# Patient Record
Sex: Female | Born: 1940 | Race: White | Hispanic: No | Smoking: Never smoker
Health system: Southern US, Community
[De-identification: ages and names within clinical notes are randomized; demographics above are authoritative.]

## PROBLEM LIST (undated history)

## (undated) DIAGNOSIS — C4490 Unspecified malignant neoplasm of skin, unspecified: Secondary | ICD-10-CM

## (undated) DIAGNOSIS — T7840XA Allergy, unspecified, initial encounter: Secondary | ICD-10-CM

## (undated) DIAGNOSIS — B029 Zoster without complications: Secondary | ICD-10-CM

## (undated) DIAGNOSIS — M810 Age-related osteoporosis without current pathological fracture: Secondary | ICD-10-CM

## (undated) DIAGNOSIS — E785 Hyperlipidemia, unspecified: Secondary | ICD-10-CM

## (undated) HISTORY — DX: Allergy, unspecified, initial encounter: T78.40XA

## (undated) HISTORY — DX: Unspecified malignant neoplasm of skin, unspecified: C44.90

## (undated) HISTORY — DX: Zoster without complications: B02.9

## (undated) HISTORY — DX: Hyperlipidemia, unspecified: E78.5

## (undated) HISTORY — DX: Age-related osteoporosis without current pathological fracture: M81.0

## (undated) HISTORY — PX: SEPTOPLASTY: SUR1290

---

## 2001-09-29 ENCOUNTER — Encounter: Payer: Self-pay | Admitting: Obstetrics and Gynecology

## 2001-09-29 ENCOUNTER — Encounter: Admission: RE | Admit: 2001-09-29 | Discharge: 2001-09-29 | Payer: Self-pay | Admitting: Obstetrics and Gynecology

## 2009-03-01 ENCOUNTER — Encounter: Admission: RE | Admit: 2009-03-01 | Discharge: 2009-03-01 | Payer: Self-pay | Admitting: Family Medicine

## 2010-10-16 IMAGING — CR DG CHEST 2V
2 series · 2 of 2 positions shown · non-contrast
Comparison: None

CLINICAL DATA: Left chest and left axillary pain

CHEST - 2 VIEW

[w chest pa]
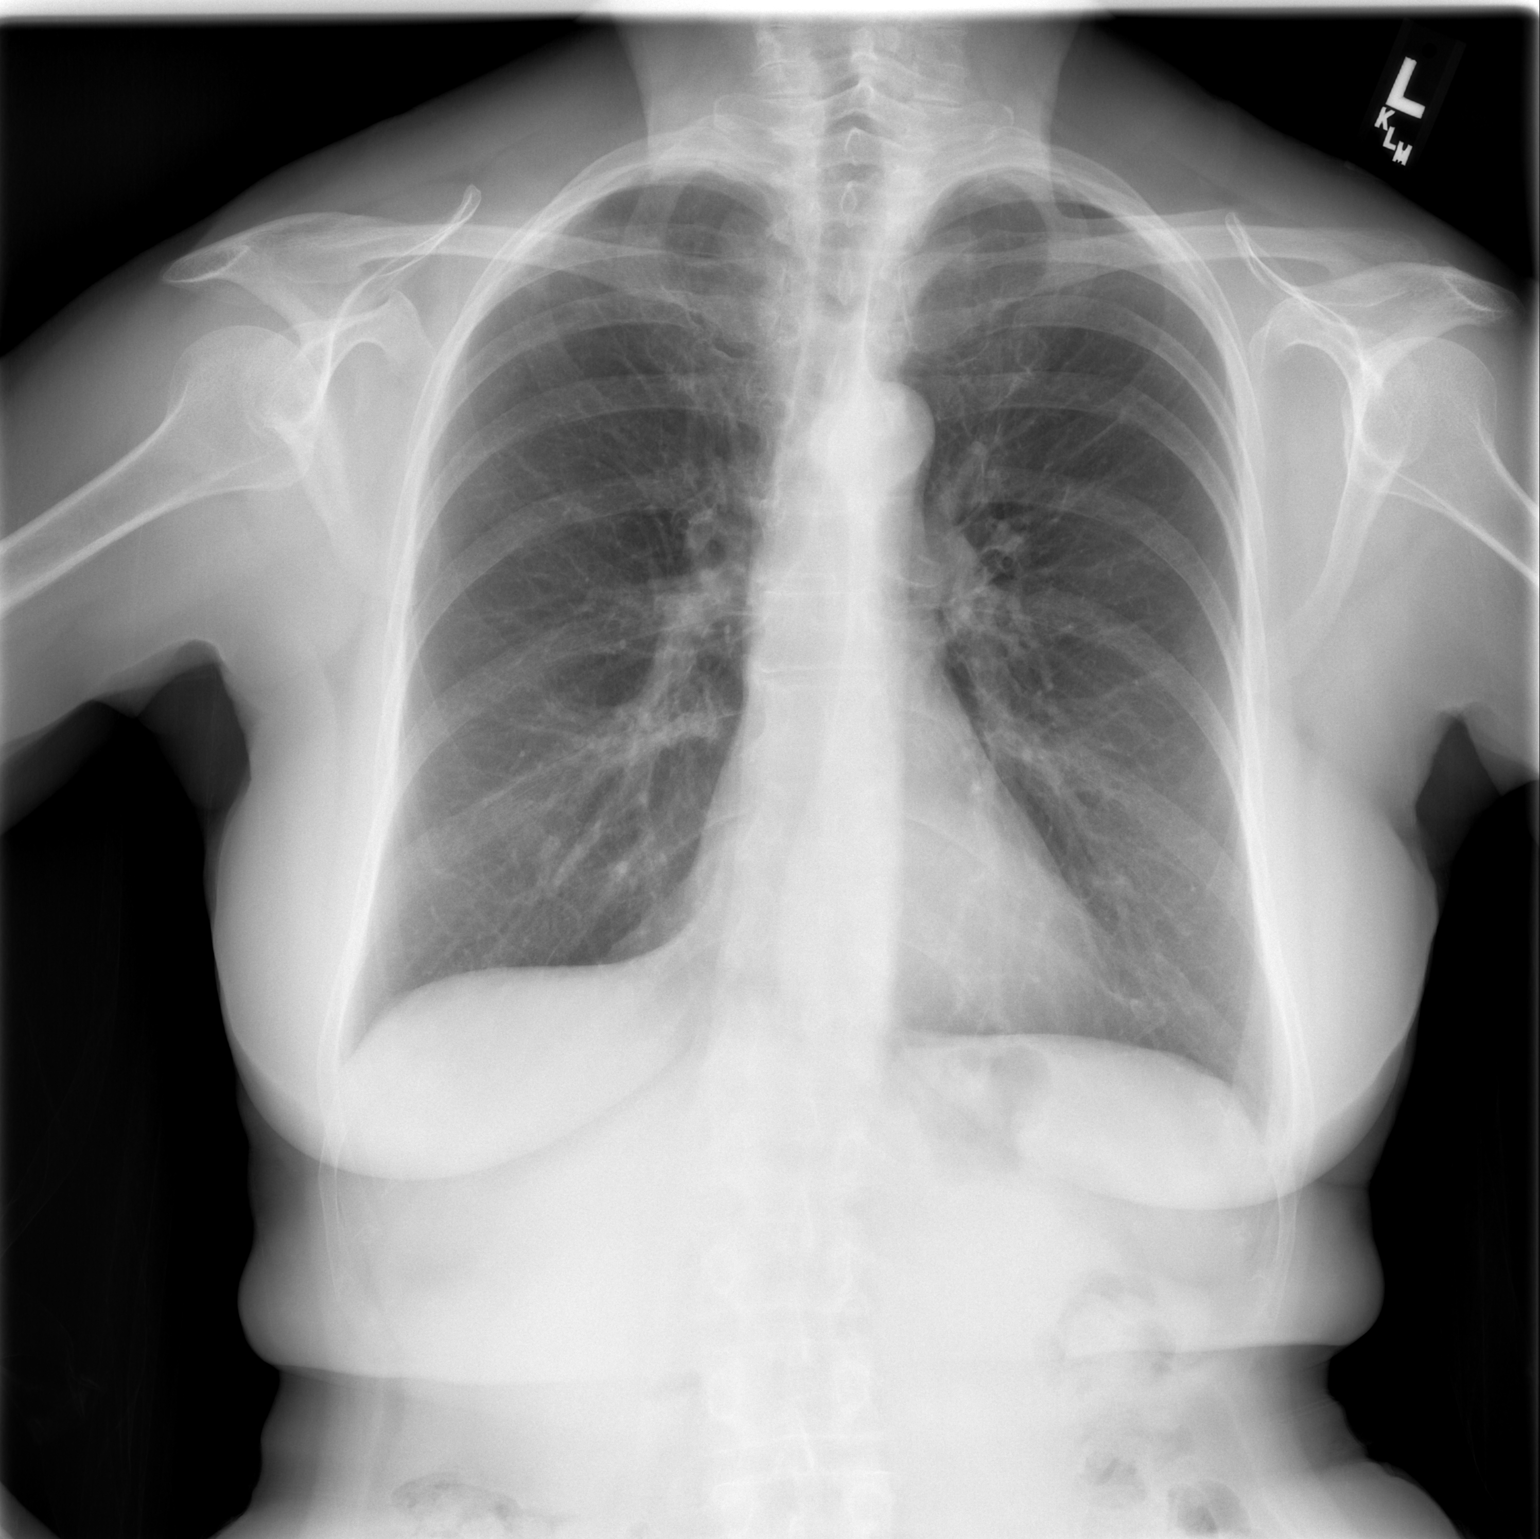

[w chest lat]
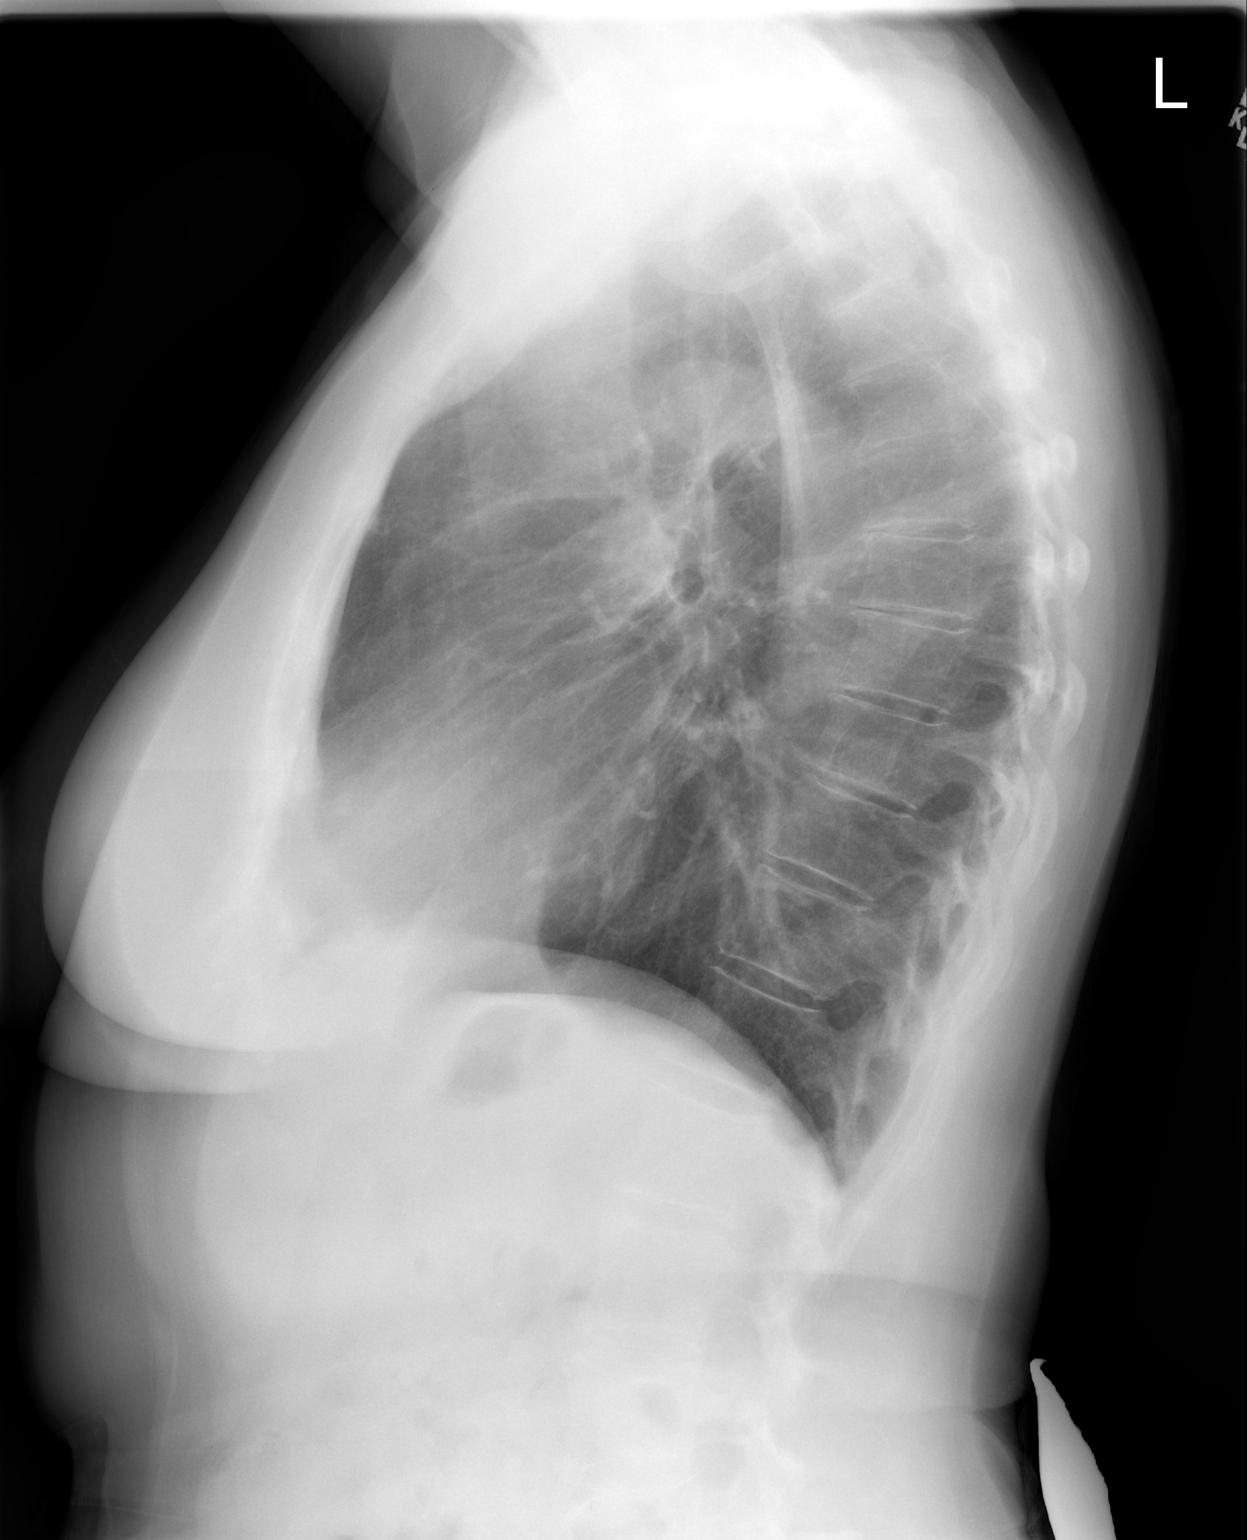

[2 of 2 positions shown; findings below may reference images not displayed]

FINDINGS: The lungs are clear. No apical lung lesion is seen.  No
hilar adenopathy is seen. The heart is within normal limits in
size. No bony abnormality is seen.
IMPRESSION: No active lung disease.

## 2011-03-01 LAB — HM PAP SMEAR: HM Pap smear: NORMAL

## 2011-05-16 ENCOUNTER — Ambulatory Visit (INDEPENDENT_AMBULATORY_CARE_PROVIDER_SITE_OTHER): Payer: Medicare Other | Admitting: Family Medicine

## 2011-05-16 ENCOUNTER — Encounter: Payer: Self-pay | Admitting: Family Medicine

## 2011-05-16 DIAGNOSIS — E7800 Pure hypercholesterolemia, unspecified: Secondary | ICD-10-CM

## 2011-05-16 DIAGNOSIS — Z79899 Other long term (current) drug therapy: Secondary | ICD-10-CM

## 2011-05-16 DIAGNOSIS — E559 Vitamin D deficiency, unspecified: Secondary | ICD-10-CM

## 2011-05-16 LAB — COMPREHENSIVE METABOLIC PANEL
ALT: 16 U/L (ref 0–35)
AST: 23 U/L (ref 0–37)
Albumin: 4.1 g/dL (ref 3.5–5.2)
Alkaline Phosphatase: 93 U/L (ref 39–117)
BUN: 17 mg/dL (ref 6–23)
CO2: 19 mEq/L (ref 19–32)
Calcium: 9.6 mg/dL (ref 8.4–10.5)
Chloride: 109 mEq/L (ref 96–112)
Creat: 0.86 mg/dL (ref 0.40–1.20)
Glucose, Bld: 87 mg/dL (ref 70–99)
Potassium: 4.4 mEq/L (ref 3.5–5.3)
Sodium: 143 mEq/L (ref 135–145)
Total Bilirubin: 0.5 mg/dL (ref 0.3–1.2)
Total Protein: 7.1 g/dL (ref 6.0–8.3)

## 2011-05-16 LAB — LIPID PANEL
HDL: 50 mg/dL (ref >39)
Triglycerides: 196 mg/dL — ABNORMAL HIGH (ref <150)

## 2011-05-16 NOTE — Progress Notes (Signed)
Subjective:    Patient ID: Holly Russo, female    DOB: 08-29-1940, 71 y.o.   MRN: [id number]  HPI Patient presents to re-establish care, and follow up on high cholesterol.  Previously took Zocor 20mg , had some achiness, but not sure it was really related to the statin medication. Has had some ongoing foot pain, even off the medication.  Per old records, last lipid check was in 2010, and was well controlled with the 20mg  of zocor.  Has been off the medication for about a year.  Resumed taking red yeast rice once daily since off the Zocor.  Following a low cholesterol diet.  She is fasting for labs today  She has a h/o low Vitamin D, and would like that checked today.  She has ongoing problems with R foot pain.  Has seen Dr. Charlett Blake, and was told it was due to arthritis.  She would like another opinion and wondering who she should see.  Past Medical History  Diagnosis Date  . Hyperlipidemia elevated chol  . Vitamin D deficiency   . Osteopenia borderline osteoporosis 10/09    -2.4 l hip 2009, last DEXA 2011 thru GYN  . Allergy   . Skin cancer     Dr. Margo Aye  . Shingles 2010    Past Surgical History  Procedure Date  . Septoplasty     History   Social History  . Marital Status: Widowed    Spouse Name: N/A    Number of Children: 3  . Years of Education: N/A   Occupational History  . Retired Public librarian    Social History Main Topics  . Smoking status: Never Smoker   . Smokeless tobacco: Not on file  . Alcohol Use: Yes     socially, glass of wine 5 times a week  . Drug Use: No  . Sexually Active: Not on file   Other Topics Concern  . Not on file   Social History Narrative  . No narrative on file    Family History  Problem Relation Age of Onset  . Coronary artery disease Mother   . Hypertension Mother   . Hyperlipidemia Mother   . Heart disease Mother   . Alzheimer's disease Father   . Thyroid disease Father   . Hyperlipidemia Sister   . Cancer  Paternal Aunt     breast  . Cancer Maternal Grandmother     pancreatic  . Diabetes Paternal Grandmother     Current outpatient prescriptions:aspirin 81 MG tablet, Take 81 mg by mouth daily.  , Disp: , Rfl: ;  Calcium Carbonate-Vitamin D (CALCIUM-VITAMIN D) 500-200 MG-UNIT per tablet, Take 1 tablet by mouth daily.  , Disp: , Rfl: ;  fish oil-omega-3 fatty acids 1000 MG capsule, Take 1 g by mouth daily.  , Disp: , Rfl: ;  Multiple Vitamins-Minerals (MULTIVITAMIN WITH MINERALS) tablet, Take 1 tablet by mouth daily.  , Disp: , Rfl:  Red Yeast Rice 600 MG TABS, Take 1 tablet by mouth daily.  , Disp: , Rfl: ;  DISCONTD: Calcium-Vitamin D-Vitamin K (CALCIUM + D + K PO), Take by mouth.  , Disp: , Rfl:   No Known Allergies  Review of Systems R foot pain.  No myalgias; Some intermittent L chest wall (lateral) pain, evaluated in the past, unchanged, intermittent. Occ heartburn (when taking NSAIDs).  Denies exertional chest pain, SOB, palpitations.  Denies GI complaints, urinary complaints, skin concerns, other joint pains.  Denies fevers, URI symptoms, or other concerns  Objective:   Physical Exam  Well developed, well nourished patient, in no distress BP 128/70  Pulse 64  Ht 5' 3.5" (1.613 m)  Wt 129 lb (58.514 kg)  BMI 22.49 kg/m2 Neck: No lymphadenopathy or thyromegaly, no carotid bruit Heart:  Regular rate and rhythm, no murmurs, rubs, gallops or ectopy Chest:  Tender L rib, focal area, mild Lungs:  Clear bilaterally, without wheezes, rales or ronchi Abdomen:  Soft, nontender, nondistended, no hepatosplenomegaly or masses, normal bowel sounds Extremities:  No clubbing, cyanosis or edema, 2+ pulses.  Neuro:  Alert and oriented x 3, cranial nerves grossly intact.  DTR's 2+ and symmetric.  Normal strength and sensation Back:  No spine or CVA tenderness Skin: no rashes or suspicious lesions Psych:  Normal mood, affect, hygiene and grooming, normal speech, eye contact      Assessment &  Plan:   1. Pure hypercholesterolemia  Comprehensive metabolic panel, Lipid panel  2. Unspecified vitamin D deficiency  Vitamin D 25 hydroxy  3. Encounter for long-term (current) use of other medications  Comprehensive metabolic panel   Pharmacy: [facility], Zocor 20mg  if needed per lab results Continue low cholesterol diet and regular exercise.  Foot pain--recommended that she see either Dr. Lestine Box or a podiatrist (rec [facility]) for second opinion regarding her ongoing foot pain

## 2011-05-17 ENCOUNTER — Telehealth: Payer: Self-pay | Admitting: *Deleted

## 2011-05-17 NOTE — Telephone Encounter (Signed)
Left message for pt to return my call.vs

## 2011-05-18 ENCOUNTER — Telehealth: Payer: Self-pay | Admitting: Family Medicine

## 2011-05-18 NOTE — Telephone Encounter (Signed)
Pt came by for lab results, gave pt copy & advised above notes from Dr. Lynelle Doctor and called in Simvastatin to [facility] [phone] as above-LM

## 2011-05-21 ENCOUNTER — Telehealth: Payer: Self-pay | Admitting: Family Medicine

## 2011-05-21 NOTE — Telephone Encounter (Signed)
Message copied by Laureen Ochs on Mon May 21, 2011 12:11 PM ------      Message from: KNAPP, EVE      Created: Thu May 17, 2011  1:29 PM       Advise pt of results.  Recommend re-starting simvastatin 20mg  daily, and stopping the red yeast rice.  Please send Rx to [facility] for #30 with 3 refills, and schedule LFT/FLP labs for 3 months.  Chem panel and Vitamin D are normal

## 2011-05-22 ENCOUNTER — Encounter: Payer: Self-pay | Admitting: Family Medicine

## 2011-05-22 NOTE — Progress Notes (Signed)
Phoned pt. lmtrc re labs.

## 2011-05-23 ENCOUNTER — Telehealth: Payer: Self-pay | Admitting: *Deleted

## 2011-05-23 NOTE — Telephone Encounter (Signed)
Left message again for pt to call me [id number].

## 2011-05-25 NOTE — Progress Notes (Signed)
Left message for pt to return my call to go over lab results.

## 2011-05-31 ENCOUNTER — Telehealth: Payer: Self-pay | Admitting: *Deleted

## 2011-05-31 NOTE — Telephone Encounter (Signed)
Left message for patient to return my call for lab results. vs

## 2011-08-27 ENCOUNTER — Encounter: Payer: Self-pay | Admitting: Family Medicine

## 2011-08-27 ENCOUNTER — Ambulatory Visit (INDEPENDENT_AMBULATORY_CARE_PROVIDER_SITE_OTHER): Payer: Medicare Other | Admitting: Family Medicine

## 2011-08-27 VITALS — BP 140/88 | HR 76 | Ht 64.0 in | Wt 126.0 lb

## 2011-08-27 DIAGNOSIS — B029 Zoster without complications: Secondary | ICD-10-CM

## 2011-08-27 MED ORDER — ACYCLOVIR 800 MG PO TABS: ORAL_TABLET | ORAL | Status: DC

## 2011-08-27 NOTE — Patient Instructions (Signed)
Please take the medication 5 times/day for a week.  If you start having any eye symptoms at all in the right eye, please follow up with your eye doctor

## 2011-08-27 NOTE — Progress Notes (Signed)
Last night, before going to bed, she noticed that her forehead was itchy, then noticed some little red bumps in same area that she had shingles in the past.  Last episode was 2 years ago.  Has had constant itchy feeling in that area of her forehead ever since her shingles.  Last episode of shingles involved her forehead, eye, nose and scalp (occurred while she was in [place]).  Itching has intensified, now slightly tingly also.  She took Acyclovir this morning--it was something her daughter had given her (she takes for herpes, ?might be 400mg ).  Denies any eye symptoms currently.  Past Medical History  Diagnosis Date  . Hyperlipidemia elevated chol  . Vitamin D deficiency   . Osteopenia borderline osteoporosis 10/09    -2.4 l hip 2009, last DEXA 2011 thru GYN  . Allergy   . Skin cancer     Dr. Margo Aye  . Shingles 2010    Past Surgical History  Procedure Date  . Septoplasty     History   Social History  . Marital Status: Widowed    Spouse Name: N/A    Number of Children: 3  . Years of Education: N/A   Occupational History  . Retired Public librarian    Social History Main Topics  . Smoking status: Never Smoker   . Smokeless tobacco: Not on file  . Alcohol Use: Yes     socially, glass of wine 5 times a week  . Drug Use: No  . Sexually Active: Not on file   Other Topics Concern  . Not on file   Social History Narrative  . No narrative on file    Family History  Problem Relation Age of Onset  . Coronary artery disease Mother   . Hypertension Mother   . Hyperlipidemia Mother   . Heart disease Mother   . Alzheimer's disease Father   . Thyroid disease Father   . Hyperlipidemia Sister   . Cancer Paternal Aunt     breast  . Cancer Maternal Grandmother     pancreatic  . Diabetes Paternal Grandmother     Current outpatient prescriptions:aspirin 81 MG tablet, Take 81 mg by mouth daily.  , Disp: , Rfl: ;  Calcium Carbonate-Vitamin D (CALCIUM-VITAMIN D) 500-200 MG-UNIT  per tablet, Take 1 tablet by mouth daily.  , Disp: , Rfl: ;  fish oil-omega-3 fatty acids 1000 MG capsule, Take 1 g by mouth daily.  , Disp: , Rfl: ;  Multiple Vitamins-Minerals (MULTIVITAMIN WITH MINERALS) tablet, Take 1 tablet by mouth daily.  , Disp: , Rfl:  simvastatin (ZOCOR) 20 MG tablet, Take 20 mg by mouth at bedtime.  , Disp: , Rfl: ;  acyclovir (ZOVIRAX) 800 MG tablet, Take 1 tablet by mouth 5 times a day for 7 days, Disp: 35 tablet, Rfl: 0  Allergies  Allergen Reactions  . Sulfa Antibiotics Rash   ROS: Denies fevers, eye symptoms, nose symptoms, URI symptoms, or other concerns  BP 140/88  Pulse 76  Ht 5\' 4"  (1.626 m)  Wt 126 lb (57.153 kg)  BMI 21.63 kg/m2 Pleasant, somewhat anxious female, in no distress Face: three horizontal excoriations on forehead. No vesicles or other rash noted, somewhat rough to the touch R frontal area.  Nose without lesions. Conjunctiva and sclera clear. Neck: no lymphadenopathy  ASSESSMENT/PLAN: 1. Shingles  acyclovir (ZOVIRAX) 800 MG tablet   F/u with optho if eye symptoms develop

## 2012-02-18 ENCOUNTER — Encounter: Payer: Self-pay | Admitting: Internal Medicine

## 2012-02-21 ENCOUNTER — Encounter: Payer: Self-pay | Admitting: Family Medicine

## 2012-02-21 ENCOUNTER — Ambulatory Visit (INDEPENDENT_AMBULATORY_CARE_PROVIDER_SITE_OTHER): Payer: Medicare Other | Admitting: Family Medicine

## 2012-02-21 DIAGNOSIS — E7800 Pure hypercholesterolemia, unspecified: Secondary | ICD-10-CM

## 2012-02-21 DIAGNOSIS — IMO0001 Reserved for inherently not codable concepts without codable children: Secondary | ICD-10-CM

## 2012-02-21 DIAGNOSIS — R030 Elevated blood-pressure reading, without diagnosis of hypertension: Secondary | ICD-10-CM

## 2012-02-21 NOTE — Patient Instructions (Signed)
Try and exercise at least 30 minutes daily.  Follow low sodium (see below). Try and check your blood pressure elsewhere, and write down.  Bring your list of BP's and your monitor to your next visit  Return for fasting bloodwork.  I suspect that we will need to start a lipid-lowering medication.  We will try atorvastatin (generic Lipitor), but I need to see labs before deciding dose.  Start taking Coenzyme Q10 along with the statin medication as this can help prevent side effects.  Return in 2-3 months for repeat labs, to make sure that the dose is sufficient and that liver is handling the medication  I recommend [facility] for evaluation of ongoing right foot pain

## 2012-02-21 NOTE — Progress Notes (Signed)
Chief complaint:  med check, having muscle pain from simvastatin. Hasn't been taking for several months and feels better. She isn't fasting today  HPI: Hyperlipidemia follow-up: She restarted simvastatin after labs last May.  Took it until December, realized that she was achey, sore, hurt to get out of the car.  Stopped taking it and achiness resolved. Hasn't been taking red yeast rice.  Elevated BP today.  Doesn't check BP elsewhere.  Denies increased sodium intake.  Walks some, but not as much as in warmer weather.   Shingles to R forehead in August--occasionally has some tingling, feels the need to scratch, but no discomfort or burning pain.  Past Medical History  Diagnosis Date  . Hyperlipidemia elevated chol  . Vitamin d deficiency   . Osteopenia borderline osteoporosis 10/09    -2.4 l hip 2009, last DEXA 2011 thru GYN  . Allergy   . Skin cancer     Dr. Margo Aye  . Shingles 2010, 08/2011    Past Surgical History  Procedure Date  . Septoplasty     History   Social History  . Marital Status: Widowed    Spouse Name: N/A    Number of Children: 3  . Years of Education: N/A   Occupational History  . Retired Public librarian    Social History Main Topics  . Smoking status: Never Smoker   . Smokeless tobacco: Never Used  . Alcohol Use: Yes     socially, glass of wine 5 times a week  . Drug Use: No  . Sexually Active: Not on file   Other Topics Concern  . Not on file   Social History Narrative  . No narrative on file    Family History  Problem Relation Age of Onset  . Coronary artery disease Mother   . Hypertension Mother   . Hyperlipidemia Mother   . Heart disease Mother   . Alzheimer's disease Father   . Thyroid disease Father   . Hyperlipidemia Sister   . Cancer Paternal Aunt     breast  . Cancer Maternal Grandmother     pancreatic  . Diabetes Paternal Grandmother     Current outpatient prescriptions:aspirin 81 MG tablet, Take 81 mg by mouth daily.   , Disp: , Rfl: ;  Calcium Carbonate-Vitamin D (CALCIUM-VITAMIN D) 500-200 MG-UNIT per tablet, Take 1 tablet by mouth daily.  , Disp: , Rfl: ;  fish oil-omega-3 fatty acids 1000 MG capsule, Take 1 g by mouth daily.  , Disp: , Rfl: ;  Multiple Vitamins-Minerals (MULTIVITAMIN WITH MINERALS) tablet, Take 1 tablet by mouth daily.  , Disp: , Rfl:  simvastatin (ZOCOR) 20 MG tablet, Take 20 mg by mouth at bedtime.  , Disp: , Rfl:   Allergies  Allergen Reactions  . Sulfa Antibiotics Rash   ROS:  Denies fevers, URI symptoms, headaches, chest pain, dizziness, GI complaints.  Has some ongoing R foot pain.  Previously saw ortho, asking for recommendation for podiatrist.  Denies swelling, rashes, or other concerns.  PHYSICAL EXAM BP 152/92  Pulse 64  Ht 5\' 4"  (1.626 m)  Wt 132 lb (59.875 kg)  BMI 22.66 kg/m2 150/88 on repeat by PA student Neck: no lymphadenopathy, thyromegaly or carotid bruit Lungs: clear bilaterally Heart: regular rate and rhythm without murmur Skin: no rash Abdomen: nontender, no organomegaly or mass Extremities: no edema  ASSESSMENT/PLAN: 1. Pure hypercholesterolemia   2. Elevated BP    Baseline lipids to be checked this week Start lipitor (dose depending on  labs) Recommend trial of Coenzyme Q10 along with the statin  Low cholesterol diet  Re-check in 2-3 months  Elevated BP--low sodium diet.  Check BP elsewhere (has machine at home), keep log Exercise daily  Call if not tolerating medication for change  Recommended [facility] for her ongoing foot pain

## 2012-02-22 ENCOUNTER — Other Ambulatory Visit: Payer: Medicare Other

## 2012-02-22 DIAGNOSIS — E7800 Pure hypercholesterolemia, unspecified: Secondary | ICD-10-CM

## 2012-02-22 LAB — HEPATIC FUNCTION PANEL
AST: 19 U/L (ref 0–37)
Albumin: 3.9 g/dL (ref 3.5–5.2)
Total Bilirubin: 0.4 mg/dL (ref 0.3–1.2)
Total Protein: 6.7 g/dL (ref 6.0–8.3)

## 2012-02-22 LAB — LIPID PANEL
HDL: 65 mg/dL (ref >39)
LDL Cholesterol: 154 mg/dL — ABNORMAL HIGH (ref 0–99)
Total CHOL/HDL Ratio: 3.8 Ratio
Triglycerides: 136 mg/dL (ref <150)

## 2012-03-03 ENCOUNTER — Other Ambulatory Visit: Payer: Self-pay | Admitting: *Deleted

## 2012-03-03 DIAGNOSIS — E7800 Pure hypercholesterolemia, unspecified: Secondary | ICD-10-CM

## 2012-03-03 DIAGNOSIS — Z79899 Other long term (current) drug therapy: Secondary | ICD-10-CM

## 2012-03-03 MED ORDER — ATORVASTATIN CALCIUM 10 MG PO TABS: 10.0000 mg | ORAL_TABLET | Freq: Every day | ORAL | Status: DC

## 2012-03-31 ENCOUNTER — Other Ambulatory Visit: Payer: Self-pay | Admitting: *Deleted

## 2012-03-31 DIAGNOSIS — E7800 Pure hypercholesterolemia, unspecified: Secondary | ICD-10-CM

## 2012-03-31 MED ORDER — ATORVASTATIN CALCIUM 10 MG PO TABS: 10.0000 mg | ORAL_TABLET | Freq: Every day | ORAL | Status: DC

## 2012-03-31 NOTE — Telephone Encounter (Signed)
Patient came in for a bp check and her machine was accurate. I got 146/90 on her left arm and her machine read 144/89. She is going to schedule appt with you if her numbers remain or increase.

## 2012-04-30 ENCOUNTER — Other Ambulatory Visit: Payer: Medicare Other

## 2012-04-30 DIAGNOSIS — Z79899 Other long term (current) drug therapy: Secondary | ICD-10-CM

## 2012-04-30 DIAGNOSIS — E7800 Pure hypercholesterolemia, unspecified: Secondary | ICD-10-CM

## 2012-05-01 LAB — HEPATIC FUNCTION PANEL
Albumin: 4 g/dL (ref 3.5–5.2)
Alkaline Phosphatase: 94 U/L (ref 39–117)
Total Protein: 6.7 g/dL (ref 6.0–8.3)

## 2012-05-01 LAB — LIPID PANEL
HDL: 61 mg/dL (ref >39)
LDL Cholesterol: 81 mg/dL (ref 0–99)
Triglycerides: 155 mg/dL — ABNORMAL HIGH (ref <150)
VLDL: 31 mg/dL (ref 0–40)

## 2012-05-02 ENCOUNTER — Telehealth: Payer: Self-pay | Admitting: Internal Medicine

## 2012-05-02 DIAGNOSIS — E7800 Pure hypercholesterolemia, unspecified: Secondary | ICD-10-CM

## 2012-05-02 MED ORDER — ATORVASTATIN CALCIUM 10 MG PO TABS: 10.0000 mg | ORAL_TABLET | Freq: Every day | ORAL | Status: DC

## 2012-05-02 NOTE — Telephone Encounter (Signed)
done

## 2012-09-19 ENCOUNTER — Encounter: Payer: Self-pay | Admitting: Gynecology

## 2012-09-23 ENCOUNTER — Ambulatory Visit (INDEPENDENT_AMBULATORY_CARE_PROVIDER_SITE_OTHER): Payer: Medicare Other | Admitting: Gynecology

## 2012-09-23 ENCOUNTER — Encounter: Payer: Self-pay | Admitting: Gynecology

## 2012-09-23 VITALS — BP 142/80 | Ht 65.0 in | Wt 129.0 lb

## 2012-09-23 DIAGNOSIS — M8580 Other specified disorders of bone density and structure, unspecified site: Secondary | ICD-10-CM

## 2012-09-23 DIAGNOSIS — N9089 Other specified noninflammatory disorders of vulva and perineum: Secondary | ICD-10-CM

## 2012-09-23 DIAGNOSIS — M899 Disorder of bone, unspecified: Secondary | ICD-10-CM

## 2012-09-23 DIAGNOSIS — N952 Postmenopausal atrophic vaginitis: Secondary | ICD-10-CM

## 2012-09-23 DIAGNOSIS — M949 Disorder of cartilage, unspecified: Secondary | ICD-10-CM

## 2012-09-23 NOTE — Progress Notes (Signed)
Patient ID: Holly Russo, female   DOB: [birth year], 72 y.o.   MRN: [id number] Holly Russo [birth year] [id number]        72 y.o.  G1P1001 new patient for follow up exam, former patient of Dr. Leota Sauers.  Several issues noted below  Past medical history,surgical history, medications, allergies, family history and social history were all reviewed and documented in the EPIC chart. ROS:  Was performed and pertinent positives and negatives are included in the history.  Exam: Fleet Contras assistant Filed Vitals:   09/23/12 1020  BP: 142/80  Height: 5\' 5"  (1.651 m)  Weight: 129 lb (58.514 kg)   General appearance  Normal Skin grossly normal Head/Neck normal with no cervical or supraclavicular adenopathy thyroid normal Lungs  clear Cardiac RR, without RMG Abdominal  soft, nontender, without masses, organomegaly or hernia Breasts  examined lying and sitting without masses, retractions, discharge or axillary adenopathy. Pelvic  Ext/BUS/vagina  Atrophic changes with pigmented elliptical lesion left lower inner labia lateral to posterior fourchette Physical Exam  Genitourinary:        Cervix  normal with atrophic changes  Uterus  axial, normal size, shape and contour, midline and mobile nontender   Adnexa  Without masses or tenderness    Anus and perineum  normal   Rectovaginal  normal sphincter tone without palpated masses or tenderness.    Assessment/Plan:  72 y.o. G50P1001 female for follow up exam.   1. Vulvar lesion. Patient has a pigmented lesion that has never been noticed before. Although benign in appearance of recommended having this excised and she agrees with this. Subsequently the area was cleansed with Betadine, infiltrated with 1% lidocaine an elliptical incision was made to excise the entire lesion. 2 3-0 Vicryl sutures used to reapproximate the skin. Postoperative instructions given to the patient. Patient will follow up for biopsy results. 2. Postmenopausal/atrophic  genital changes. She had previously been on HRT a number of years ago but is doing well off of HRT without significant symptoms and we'll continue to monitor. 3. Pap smear. No Pap smear done today. Last Pap smear 2012. Patient has numerous normal records in her chart. Reviewed current screening guidelines as she is over the age of 72 we'll plan stop screening now she agrees with this. 4. Mammography. Patient just had her mammogram from [facility] and the report in the computer states it needs additional views. Patient was never notified get of this. I discussed this with the patient and she knows to call [facility] to arrange additional views as they've recommended. SBE monthly reviewed. 5. Colonoscopy. Patient had several years ago and was recommended to repeat in 5 years. In this is coming due next year and she knows to schedule this. 6. Osteopenia. Patient has reported osteopenia and a T score -2.4 although has not had a DEXA and a number of years. Recommend DEXA now she will schedule this. Increase calcium vitamin D reviewed. Has never been on pharmacologic treatment for her bones. We'll further discuss after her DEXA results. 7. Health maintenance. No blood work was done as it is all done through her primary physician's office who she actively sees. Follow up for DEXA and biopsy results. Otherwise follow up in one year for annual exam.    Dara Lords MD, 12:30 PM 09/23/2012

## 2012-09-23 NOTE — Patient Instructions (Addendum)
Follow up for bone density as scheduled Office will call you with biopsy reults. Follow up for annual exam in one year.

## 2012-09-24 LAB — URINALYSIS W MICROSCOPIC + REFLEX CULTURE
Crystals: NONE SEEN
Ketones, ur: NEGATIVE mg/dL
Nitrite: NEGATIVE
Specific Gravity, Urine: 1.009 (ref 1.005–1.030)
Urobilinogen, UA: 0.2 mg/dL (ref 0.0–1.0)

## 2012-09-26 ENCOUNTER — Telehealth: Payer: Self-pay | Admitting: Gynecology

## 2012-09-26 MED ORDER — NITROFURANTOIN MONOHYD MACRO 100 MG PO CAPS: 100.0000 mg | ORAL_CAPSULE | Freq: Two times a day (BID) | ORAL | Status: DC

## 2012-09-26 NOTE — Telephone Encounter (Signed)
Tell patient that she has a urinary tract infection on her urinalysis I 1 to treat her with antibiotics. We'll use Macrobid 100 twice a day x7 days

## 2012-09-26 NOTE — Telephone Encounter (Signed)
Left message for pt to call.

## 2012-09-27 LAB — URINE CULTURE: Colony Count: >=100000

## 2012-09-30 ENCOUNTER — Telehealth: Payer: Self-pay | Admitting: *Deleted

## 2012-09-30 NOTE — Telephone Encounter (Signed)
Left message on voicemail rx at pharmacy for pick up.

## 2012-09-30 NOTE — Telephone Encounter (Signed)
Pharmacy faxed over prior auth. Form to be completed. Form faxed to [employer] [facility].

## 2012-10-01 NOTE — Telephone Encounter (Signed)
Macrobid 100 mg tablet approved through 09/30/13

## 2012-10-02 NOTE — Telephone Encounter (Signed)
Spoke with the pharmacy and pick up rx today.

## 2012-10-09 ENCOUNTER — Ambulatory Visit (INDEPENDENT_AMBULATORY_CARE_PROVIDER_SITE_OTHER): Payer: Medicare Other

## 2012-10-09 DIAGNOSIS — M810 Age-related osteoporosis without current pathological fracture: Secondary | ICD-10-CM

## 2012-10-09 DIAGNOSIS — M8580 Other specified disorders of bone density and structure, unspecified site: Secondary | ICD-10-CM

## 2012-10-09 HISTORY — DX: Age-related osteoporosis without current pathological fracture: M81.0

## 2012-10-10 ENCOUNTER — Telehealth: Payer: Self-pay | Admitting: Gynecology

## 2012-10-10 ENCOUNTER — Encounter: Payer: Self-pay | Admitting: Gynecology

## 2012-10-10 NOTE — Telephone Encounter (Signed)
Left message for pt to make OV.

## 2012-10-10 NOTE — Telephone Encounter (Signed)
Tell patient DEXA shows osteoporosis. Recommend office visit to discuss treatment options.

## 2012-10-23 NOTE — Telephone Encounter (Signed)
Left message for pt to call.

## 2012-10-24 ENCOUNTER — Encounter: Payer: Self-pay | Admitting: Gynecology

## 2012-10-27 NOTE — Telephone Encounter (Signed)
Spoke with pt regarding the below note, pt said she has been very busy, copy mailed to pt. Pt said she will make OV when able.

## 2013-01-02 ENCOUNTER — Other Ambulatory Visit: Payer: Self-pay | Admitting: Family Medicine

## 2013-07-01 ENCOUNTER — Ambulatory Visit (INDEPENDENT_AMBULATORY_CARE_PROVIDER_SITE_OTHER): Payer: Medicare Other | Admitting: Family Medicine

## 2013-07-01 ENCOUNTER — Encounter: Payer: Self-pay | Admitting: Family Medicine

## 2013-07-01 VITALS — BP 146/80 | HR 88 | Ht 64.0 in | Wt 127.0 lb

## 2013-07-01 DIAGNOSIS — R030 Elevated blood-pressure reading, without diagnosis of hypertension: Secondary | ICD-10-CM

## 2013-07-01 DIAGNOSIS — G4700 Insomnia, unspecified: Secondary | ICD-10-CM

## 2013-07-01 DIAGNOSIS — IMO0001 Reserved for inherently not codable concepts without codable children: Secondary | ICD-10-CM

## 2013-07-01 MED ORDER — ZOLPIDEM TARTRATE 10 MG PO TABS: 5.0000 mg | ORAL_TABLET | Freq: Every evening | ORAL | Status: DC | PRN

## 2013-07-01 NOTE — Progress Notes (Signed)
Chief Complaint  Patient presents with  . Advice Only    has noticed that blood pressure has been increasing. Has had some traumatic issues in the last month, son committed suicide last month. Feels like she isn't enough sleep either.    Checks BP's once weekly, and ranges from 120-150/70's-90.  130/80 recently at the dentist when she needed a tooth pulled.  BP's have fluctuated like this since September.  She has a strong family h/o hypertension.  She recalls seeing these BP's long before this last month with increased stress. She is going out of town (to [place]) for the summer, and wants to be checked out prior to leaving, to make sure she is okay.  6/11 her son committed suicide.  He has h/o bipolar d/o, was living with her. Since his death, she hasn't been getting much sleep--doing a little better now than the first week, but still intermittently having bad nights, sleeping only about 4 hours.  Melatonin doesn't seem to help.  Can't take benadryl-containing medications or it "wires" her.  Sometimes will take advil--helps with some joint aches, and helps her sleep a little.  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  Bat in her house x 2 days.  It is being sent to [facility] for rabies testing.  No known bites or injuries from the bat in the house.  Should be hearing the results any day now, prior to leaving for [place].  Past Medical History  Diagnosis Date  . Hyperlipidemia elevated chol  . Vitamin D deficiency   . Osteoporosis 10/09/2012     T score -2.8  . Allergy   . Skin cancer     Dr. Margo Aye  . Shingles 2010, 08/2011   Past Surgical History  Procedure Laterality Date  . Septoplasty     History   Social History  . Marital Status: Widowed    Spouse Name: N/A    Number of Children: 3  . Years of Education: N/A   Occupational History  . Retired Public librarian    Social History Main Topics  . Smoking status: Never Smoker   . Smokeless tobacco: Never Used  . Alcohol Use: Yes     Comment:  socially, glass of wine 5 times a week  . Drug Use: No  . Sexually Active: No   Other Topics Concern  . Not on file   Social History Narrative  . No narrative on file   Family History  Problem Relation Age of Onset  . Coronary artery disease Mother   . Hypertension Mother   . Hyperlipidemia Mother   . Heart disease Mother   . Alzheimer's disease Father   . Thyroid disease Father   . Hyperlipidemia Sister   . Cancer Paternal Aunt     breast  . Cancer Maternal Grandmother     pancreatic  . Diabetes Paternal Grandmother   . Bipolar disorder Son     Current outpatient prescriptions:aspirin 81 MG tablet, Take 81 mg by mouth daily.  , Disp: , Rfl: ;  Calcium Carbonate-Vitamin D (CALCIUM-VITAMIN D) 500-200 MG-UNIT per tablet, Take 1 tablet by mouth daily.  , Disp: , Rfl: ;  co-enzyme Q-10 30 MG capsule, Take 30 mg by mouth 3 (three) times daily., Disp: , Rfl: ;  fish oil-omega-3 fatty acids 1000 MG capsule, Take 1 g by mouth daily.  , Disp: , Rfl:  Multiple Vitamins-Minerals (MULTIVITAMIN WITH MINERALS) tablet, Take 1 tablet by mouth daily.  , Disp: , Rfl: ;  atorvastatin (LIPITOR) 10 MG tablet, Take 1 tablet (10 mg total) by mouth daily., Disp: 30 tablet, Rfl: 2  Allergies  Allergen Reactions  . Sulfa Antibiotics Rash   ROS:  Denies fevers, nausea, vomiting, bowel changes.  Some indigestion since taking NSAIDs in past (for foot pain).  Denies chest pain, headaches, edema, URI symptoms, cough, shortness of breath.   PHYSICAL EXAM: BP 150/90  Pulse 88  Ht 5\' 4"  (1.626 m)  Wt 127 lb (57.607 kg)  BMI 21.79 kg/m2 146/80 on repeat by MD Well developed, pleasant but somewhat anxious appearing female, on verge of tears at times during conversation.  She is in no distress HEENT:  PERRL, EOMI, conjunctiva clear.   Neck: no lymphadenopathy, thyromegaly or carotid bruit Heart: regular rate and rhythm without murmur Lungs: clear bilaterally Abdomen: soft, nontender, no organomegaly or  mass, no abdominal bruit Extremities: no edema Skin: no rash, lesions, bruising Psych: mildly depressed and anxious-appearing.  Normal eye contact, speech, hygiene and grooming Neuro: alert and oriented.  Cranial nerves intact.  Normal strength, sensation, gait  EKG:  NSR, rate 78.  No evidence of LVH or ischemia. (no priors for comparison)  ASSESSMENT/PLAN:  Insomnia - related to recent stress, grief reaction from loss of son.  discussed benzo's vs zolpidem.  She prefers trial of zolpidem.  Risks/side effects reviewed.  - Plan: zolpidem (AMBIEN) 10 MG tablet  Elevated blood pressure - intermittent.  counseled re: possible contributing causes, including stress, lack of sleep, sodium, etc.  Continue to monitor BP's. Discussed in detail the potential contributing factors to elevated BP.  Encouraged stress reduction, exercise, low sodium diet.   May improve as her sleep and stress improves.  Reassured no evidence of damage to body (ie no LVH).  Insomnia likely contributing to elevated BP's.  Prefers zolpidem over benzo's. Risks and side effect reviewed.  Encouraged her to start at just 1/2 tablet daily, use full tablet only if 1/2 is ineffective.  Encouraged ongoing NAMI support, consider grief counseling.  F/u at [facility], sooner prn.  Visit >25 minutes, more than 1/2 spent counseling.

## 2013-07-01 NOTE — Patient Instructions (Addendum)
Try just 1/2 tablet of zolpidem at bedtime, ensuring that you have at least 8 hours before driving in morning.  Continue to monitor blood pressure. Consider grief counseling (through hospice or elsewhere).

## 2013-07-06 NOTE — Addendum Note (Signed)
Addended by: Debbrah Alar F on: 07/06/2013 11:10 AM   Modules accepted: Orders

## 2013-09-09 ENCOUNTER — Other Ambulatory Visit: Payer: Self-pay | Admitting: *Deleted

## 2013-09-09 DIAGNOSIS — E7800 Pure hypercholesterolemia, unspecified: Secondary | ICD-10-CM

## 2013-09-09 MED ORDER — ATORVASTATIN CALCIUM 10 MG PO TABS: 10.0000 mg | ORAL_TABLET | Freq: Every day | ORAL | Status: DC

## 2013-09-23 ENCOUNTER — Ambulatory Visit (INDEPENDENT_AMBULATORY_CARE_PROVIDER_SITE_OTHER): Payer: Medicare Other | Admitting: Family Medicine

## 2013-09-23 ENCOUNTER — Encounter: Payer: Self-pay | Admitting: Family Medicine

## 2013-09-23 VITALS — BP 150/82 | HR 76 | Temp 98.3°F | Ht 64.0 in | Wt 128.0 lb

## 2013-09-23 DIAGNOSIS — IMO0001 Reserved for inherently not codable concepts without codable children: Secondary | ICD-10-CM

## 2013-09-23 DIAGNOSIS — Z23 Encounter for immunization: Secondary | ICD-10-CM

## 2013-09-23 DIAGNOSIS — R21 Rash and other nonspecific skin eruption: Secondary | ICD-10-CM

## 2013-09-23 DIAGNOSIS — R030 Elevated blood-pressure reading, without diagnosis of hypertension: Secondary | ICD-10-CM

## 2013-09-23 NOTE — Progress Notes (Signed)
Chief Complaint  Patient presents with  . Advice Only    has two bites on her right side (abdomen). First appeared yesterday and the second last night. These are very itchy but not painful. Patient states that she has had shingles in the past and is concerned that this may be shingles as well.   . Dizziness    over the last week or so she has had 2 different times where she was lightheaded, just for a moment, when she turned her head.    She noticed one "bite" on right flank yesterday, noticed the second one later that night, on same side of body.  She has had shingles twice, and thought this could be recurrence--started taking acyclovir 800mg  that she had left over.  Rash is very itchy.  Denies any pain--no burning or discomfort.  H/o shingles in 2010 and 2012, and these were never painful, just itchy.  One was on her face/scalp. It seemed to progress pretty rapidly, and she is worried about this happening again.  Denies bug bites or itching elsewhere on her body.  She describes two episodes of dizziness--once felt momentarily lightheaded while in the shower.  A few days later when she turned her head quickly she also got momentarily light headed.  On further description, didn't feel like she would faint, just felt "different".  No true vertigo or light-headed.  She has been under a LOT of stress this summer.  Lost her son in June, house on the market in [place]--issues with sale of house, which has been extremely stressful.  Currently waiting on mortgage of the buyer to be approved.  She has been under a lot of stress, and has had elevated blood pressures in the past.  She is very worried about this, and thinks she might be ready for medications.  She has not been checking her BP's elsewhere.  Previously did this, and recalls fluctuations from 130 up to 150.  Her sister is now also on blood pressure medication.  She denies headaches, chest pain.  Wants flu shot--usually gets at pharmacy   Past  Medical History  Diagnosis Date  . Hyperlipidemia elevated chol  . Vitamin D deficiency   . Osteoporosis 10/09/2012     T score -2.8  . Allergy   . Skin cancer     Dr. Margo Aye  . Shingles 2010, 08/2011   Past Surgical History  Procedure Laterality Date  . Septoplasty     History   Social History  . Marital Status: Widowed    Spouse Name: N/A    Number of Children: 3  . Years of Education: N/A   Occupational History  . Retired Public librarian    Social History Main Topics  . Smoking status: Never Smoker   . Smokeless tobacco: Never Used  . Alcohol Use: Yes     Comment: socially, glass of wine 5 times a week  . Drug Use: No  . Sexual Activity: No   Other Topics Concern  . Not on file   Social History Narrative  . No narrative on file   Current outpatient prescriptions:aspirin 81 MG tablet, Take 81 mg by mouth daily.  , Disp: , Rfl: ;  atorvastatin (LIPITOR) 10 MG tablet, Take 1 tablet (10 mg total) by mouth daily., Disp: 30 tablet, Rfl: 1;  Calcium Carbonate-Vitamin D (CALCIUM-VITAMIN D) 500-200 MG-UNIT per tablet, Take 1 tablet by mouth daily.  , Disp: , Rfl: ;  co-enzyme Q-10 30 MG capsule, Take 30  mg by mouth 3 (three) times daily., Disp: , Rfl:  fish oil-omega-3 fatty acids 1000 MG capsule, Take 1 g by mouth daily.  , Disp: , Rfl: ;  Multiple Vitamins-Minerals (MULTIVITAMIN WITH MINERALS) tablet, Take 1 tablet by mouth daily.  , Disp: , Rfl: ;  zolpidem (AMBIEN) 10 MG tablet, Take 0.5-1 tablets (5-10 mg total) by mouth at bedtime as needed for sleep., Disp: 20 tablet, Rfl: 0  Allergies  Allergen Reactions  . Sulfa Antibiotics Rash   ROS: Denies allergy symptoms, fevers, chest pain, headaches, edema, bleeding/bruising (just some from aspirin).  Denies cough, shortness of breath. +rash (2 lesions R flank), no bleeding/bruising or other skin concerns.    PHYSICAL EXAM: BP 160/90  Pulse 76  Temp(Src) 98.3 F (36.8 C) (Oral)  Ht 5\' 4"  (1.626 m)  Wt 128 lb (58.06  kg)  BMI 21.96 kg/m2 150/82 on repeat by MD, RA Pleasant female, mildly stressed/anxious, but calmed some during visit HEENT:  PERRL, EOMI, conjunctiva clear Neck: no lymphadenopathy, thyromegaly or carotid bruit Heart: regular rate and rhythm, no murmur Lungs: clear bilaterally Extremities: no edema Skin: 2 papules R flank, mildly erythematous over entire papule (redness not spreading beyond the raised area). No warmth, crusting. Not vesicular or pustular.  No other lesions noted elsewhere on skin Psych: mildly anxious.  Full range of affect.  Normal speech, eye contact, hygiene and grooming  ASSESSMENT/PLAN:  Elevated blood pressure - stress a contributing factor.  reviewed low sodium diet, regular exercise and monitor regularly.  bring list to CPE next month  Need for prophylactic vaccination and inoculation against influenza - Plan: Flu Vaccine QUAD 36+ mos PF IM (Fluarix)  Rash and nonspecific skin eruption - suspect insect bite rather than shingles.  reviewed dermatomal distribution.  restart antiviral if spreading in dermatomal fashion. OTC HC prn, antihistamines   Discussed types of flu shots. Prefers to get quadrivalent today, since high dose isn't available.  F/u as scheduled for [facility], sooner prn (high blood pressures, symptomatic, or other concerns)

## 2013-09-23 NOTE — Patient Instructions (Addendum)
Please periodically check your blood pressure (once a week, more if not inconvenient).  Write down the date, blood pressure, pulse, and any comments (salty foods, headache, how you feel).  Bring this list to your physical next month.  Return sooner only if having headaches, or chest pain.  If persistently >140/90 then we can discuss medication at your check-up.  Follow a low sodium diet, get regular, daily exercise.  Sodium-Controlled Diet Sodium is a mineral. It is found in many foods. Sodium may be found naturally or added during the making of a food. The most common form of sodium is salt, which is made up of sodium and chloride. Reducing your sodium intake involves changing your eating habits. The following guidelines will help you reduce the sodium in your diet:  Stop using the salt shaker.  Use salt sparingly in cooking and baking.  Substitute with sodium-free seasonings and spices.  Do not use a salt substitute (potassium chloride) without your caregiver's permission.  Include a variety of fresh, unprocessed foods in your diet.  Limit the use of processed and convenience foods that are high in sodium. USE THE FOLLOWING FOODS SPARINGLY: Breads/Starches  Commercial bread stuffing, commercial pancake or waffle mixes, coating mixes. Waffles. Croutons. Prepared (boxed or frozen) potato, rice, or noodle mixes that contain salt or sodium. Salted [place] fries or hash browns. Salted popcorn, breads, crackers, chips, or snack foods. Vegetables  Vegetables canned with salt or prepared in cream, butter, or cheese sauces. Sauerkraut. Tomato or vegetable juices canned with salt.  Fresh vegetables are allowed if rinsed thoroughly. Fruit  Fruit is okay to eat. Meat and Meat Substitutes  Salted or smoked meats, such as bacon or Canadian bacon, chipped or corned beef, hot dogs, salt pork, luncheon meats, pastrami, ham, or sausage. Canned or smoked fish, poultry, or meat. Processed cheese or  cheese spreads, blue or [place]ef[place] cheese. Battered or frozen fish products. Prepared spaghetti sauce. Baked beans. Reuben sandwiches. Salted nuts. Caviar. Milk  Limit buttermilk to 1 cup per week. Soups and Combination Foods  Bouillon cubes, canned or dried soups, broth, consomm. Convenience (frozen or packaged) dinners with more than 600 mg sodium. Pot pies, pizza, Asian food, fast food cheeseburgers, and specialty sandwiches. Desserts and Sweets  Regular (salted) desserts, pie, commercial fruit snack pies, commercial snack cakes, canned puddings.  Eat desserts and sweets in moderation. Fats and Oils  Gravy mixes or canned gravy. No more than 1 to 2 tbs of salad dressing. Chip dips.  Eat fats and oils in moderation. Beverages  See those listed under the vegetables and milk groups. Condiments  Ketchup, mustard, meat sauces, salsa, regular (salted) and lite soy sauce or mustard. Dill pickles, olives, meat tenderizer. Prepared horseradish or pickle relish. Dutch-processed cocoa. Baking powder or baking soda used medicinally. Worcestershire sauce. "Light" salt. Salt substitute, unless approved by your caregiver. Document Released: 06/08/2002 Document Revised: 03/10/2012 Document Reviewed: 01/09/2010 [facility] Patient Information 2014 [place], [place].

## 2013-11-02 ENCOUNTER — Other Ambulatory Visit: Payer: Medicare Other

## 2013-11-02 DIAGNOSIS — Z79899 Other long term (current) drug therapy: Secondary | ICD-10-CM

## 2013-11-02 DIAGNOSIS — E7800 Pure hypercholesterolemia, unspecified: Secondary | ICD-10-CM

## 2013-11-02 DIAGNOSIS — R5381 Other malaise: Secondary | ICD-10-CM

## 2013-11-02 LAB — COMPREHENSIVE METABOLIC PANEL
ALT: 17 U/L (ref 0–35)
Albumin: 3.8 g/dL (ref 3.5–5.2)
CO2: 20 mEq/L (ref 19–32)
Calcium: 8.9 mg/dL (ref 8.4–10.5)
Chloride: 109 mEq/L (ref 96–112)
Potassium: 4.5 mEq/L (ref 3.5–5.3)
Sodium: 141 mEq/L (ref 135–145)
Total Protein: 6.5 g/dL (ref 6.0–8.3)

## 2013-11-02 LAB — TSH: TSH: 2.778 u[IU]/mL (ref 0.350–4.500)

## 2013-11-02 LAB — LIPID PANEL: Cholesterol: 189 mg/dL (ref 0–200)

## 2013-11-02 LAB — CBC WITH DIFFERENTIAL/PLATELET
Eosinophils Relative: 2 % (ref 0–5)
HCT: 42.4 % (ref 36.0–46.0)
Lymphocytes Relative: 30 % (ref 12–46)
Lymphs Abs: 2.2 10*3/uL (ref 0.7–4.0)
MCV: 97.5 fL (ref 78.0–100.0)
Monocytes Absolute: 0.6 10*3/uL (ref 0.1–1.0)
Neutro Abs: 4.2 10*3/uL (ref 1.7–7.7)
RBC: 4.35 MIL/uL (ref 3.87–5.11)
WBC: 7.1 10*3/uL (ref 4.0–10.5)

## 2013-11-05 ENCOUNTER — Encounter: Payer: Self-pay | Admitting: Family Medicine

## 2013-11-05 ENCOUNTER — Ambulatory Visit (INDEPENDENT_AMBULATORY_CARE_PROVIDER_SITE_OTHER): Payer: Medicare Other | Admitting: Family Medicine

## 2013-11-05 VITALS — BP 150/98 | HR 88 | Ht 63.0 in | Wt 127.0 lb

## 2013-11-05 DIAGNOSIS — E7800 Pure hypercholesterolemia, unspecified: Secondary | ICD-10-CM

## 2013-11-05 DIAGNOSIS — M810 Age-related osteoporosis without current pathological fracture: Secondary | ICD-10-CM | POA: Insufficient documentation

## 2013-11-05 DIAGNOSIS — Z1211 Encounter for screening for malignant neoplasm of colon: Secondary | ICD-10-CM

## 2013-11-05 DIAGNOSIS — Z0000 Encounter for general adult medical examination without abnormal findings: Secondary | ICD-10-CM

## 2013-11-05 DIAGNOSIS — IMO0001 Reserved for inherently not codable concepts without codable children: Secondary | ICD-10-CM

## 2013-11-05 DIAGNOSIS — R030 Elevated blood-pressure reading, without diagnosis of hypertension: Secondary | ICD-10-CM

## 2013-11-05 DIAGNOSIS — Z23 Encounter for immunization: Secondary | ICD-10-CM

## 2013-11-05 LAB — POCT URINALYSIS DIPSTICK
Bilirubin, UA: NEGATIVE
Glucose, UA: NEGATIVE
Ketones, UA: NEGATIVE
Spec Grav, UA: 1.02
pH, UA: 5

## 2013-11-05 MED ORDER — ATORVASTATIN CALCIUM 10 MG PO TABS: 10.0000 mg | ORAL_TABLET | Freq: Every day | ORAL | Status: DC

## 2013-11-05 MED ORDER — IBANDRONATE SODIUM 150 MG PO TABS: 150.0000 mg | ORAL_TABLET | ORAL | Status: DC

## 2013-11-05 MED ORDER — IBANDRONATE SODIUM 150 MG PO TABS: ORAL_TABLET | ORAL | Status: DC

## 2013-11-05 NOTE — Patient Instructions (Signed)
HEALTH MAINTENANCE RECOMMENDATIONS:  It is recommended that you get at least 30 minutes of aerobic exercise at least 5 days/week (for weight loss, you may need as much as 60-90 minutes). This can be any activity that gets your heart rate up. This can be divided in 10-15 minute intervals if needed, but try and build up your endurance at least once a week.  Weight bearing exercise is also recommended twice weekly.  Eat a healthy diet with lots of vegetables, fruits and fiber.  "Colorful" foods have a lot of vitamins (ie green vegetables, tomatoes, red peppers, etc).  Limit sweet tea, regular sodas and alcoholic beverages, all of which has a lot of calories and sugar.  Up to 1 alcoholic drink daily may be beneficial for women (unless trying to lose weight, watch sugars).  Drink a lot of water.  Calcium recommendations are 1200-1500 mg daily (1500 mg for postmenopausal women or women without ovaries), and vitamin D 1000 IU daily.  This should be obtained from diet and/or supplements (vitamins), and calcium should not be taken all at once, but in divided doses.  Monthly self breast exams and yearly mammograms for women over the age of 26 is recommended.  Sunscreen of at least SPF 30 should be used on all sun-exposed parts of the skin when outside between the hours of 10 am and 4 pm (not just when at beach or pool, but even with exercise, golf, tennis, and yard work!)  Use a sunscreen that says "broad spectrum" so it covers both UVA and UVB rays, and make sure to reapply every 1-2 hours.  Remember to change the batteries in your smoke detectors when changing your clock times in the spring and fall.  Use your seat belt every time you are in a car, and please drive safely and not be distracted with cell phones and texting while driving.  Start Boniva once weekly as we discussed.   Make sure to rest your arm on the table at heart level when checking your blood pressure.  Return in 2 months (or sooner) if  BP's are consistently >140/90 Otherwise return in 6 months.

## 2013-11-05 NOTE — Progress Notes (Signed)
Chief Complaint  Patient presents with  . Annual Exam    nonfasting annual exam no pap, sees Dr.Fontaine. UA showed 1+ leuks, no symptoms. Had DEXA 2013 and this did show osteopenia(we do not have copy, this is per pt) and she woul like to discuss osetoporosis with you. Has question anout "OsteoStrong," did bring in advertisment.    Holly Russo is a 73 y.o. female who presents for a complete physical.  She is here to follow up on her elevated blood pressures, and to discuss her last bone density/osteoporosis.   She is here to follow up on her blood pressures.  She has been monitoring at home since her last visit, and they have remained elevated, ranging from 133-184/79-108, mostly 150-160/90's.  Sometimes her head just doesn't feel right, but no significant headaches, dizziness, chest pain.  Denies shortness of breath, edema.  She isn't resting her arm on table, but lying it in lap when using her home BP arm cuff.  She also wants to discuss her bone density.  Results from 09/2012 were reviewed with patient.  She was never started on meds (DEXA was ordered by her GYN). She brings in an advertisement for OsteoStrong, but it doesn't say anything about what this treatment involves (just "non medicine")  Immunization History  Administered Date(s) Administered  . DTaP 01/01/2004  . Influenza,inj,Quad PF,36+ Mos 09/23/2013  . Pneumococcal Conjugate 03/31/2006  per old records, she had Td, not DTaP in 2005 Last Pap smear:  2013, Dr. Audie Box Last mammogram: 09/2012, scheduled for 11/2013 at [facility] Last colonoscopy: never.  Refuses (has 3 friends with h/o perforations) Last DEXA: 2013 Dentist: twice yearly Ophtho: 2 years ago Exercise:  Walks 5 miles 5 days/week (weather permitting).    Past Medical History  Diagnosis Date  . Hyperlipidemia elevated chol  . Vitamin D deficiency   . Osteoporosis 10/09/2012     T score -2.8  . Allergy   . Skin cancer     Dr. Margo Aye  . Shingles 2010, 08/2011     Past Surgical History  Procedure Laterality Date  . Septoplasty      History   Social History  . Marital Status: Widowed    Spouse Name: N/A    Number of Children: 3  . Years of Education: N/A   Occupational History  . Retired Public librarian    Social History Main Topics  . Smoking status: Never Smoker   . Smokeless tobacco: Never Used  . Alcohol Use: Yes     Comment: socially, glass of wine 5 times a week  . Drug Use: No  . Sexual Activity: No   Other Topics Concern  . Not on file   Social History Narrative   Widowed, lives alone. One daughter lives in [place], son in [place], 4 grandchildren.  Her son used to live with her, committed suicide summer 2014    Family History  Problem Relation Age of Onset  . Coronary artery disease Mother   . Hypertension Mother   . Hyperlipidemia Mother   . Heart disease Mother   . Alzheimer's disease Father   . Thyroid disease Father   . Hyperlipidemia Sister   . Hypertension Sister   . Cancer Paternal Aunt     breast  . Cancer Maternal Grandmother     pancreatic  . Diabetes Paternal Grandmother   . Bipolar disorder Son     Current outpatient prescriptions:aspirin 81 MG tablet, Take 81 mg by mouth daily.  , Disp: ,  Rfl: ;  atorvastatin (LIPITOR) 10 MG tablet, Take 1 tablet (10 mg total) by mouth daily., Disp: 90 tablet, Rfl: 1;  Calcium Carbonate-Vitamin D (CALCIUM-VITAMIN D) 500-200 MG-UNIT per tablet, Take 1 tablet by mouth daily.  , Disp: , Rfl: ;  co-enzyme Q-10 30 MG capsule, Take 100 mg by mouth daily. , Disp: , Rfl:  fish oil-omega-3 fatty acids 1000 MG capsule, Take 1 g by mouth daily.  , Disp: , Rfl: ;  Multiple Vitamins-Minerals (MULTIVITAMIN WITH MINERALS) tablet, Take 1 tablet by mouth daily.  , Disp: , Rfl: ;  ibandronate (BONIVA) 150 MG tablet, Take in the morning monthly with a full glass of water, on empty stomach; do not eat or lie down for 30 minutes., Disp: 1 tablet, Rfl: 11 zolpidem (AMBIEN) 10 MG tablet,  Take 0.5-1 tablets (5-10 mg total) by mouth at bedtime as needed for sleep., Disp: 20 tablet, Rfl: 0  Allergies  Allergen Reactions  . Sulfa Antibiotics Rash   ROS:  The patient denies anorexia, fever, weight changes, headaches,  vision changes, decreased hearing, ear pain, sore throat, breast concerns, chest pain, palpitations, dizziness, syncope, dyspnea on exertion, cough, swelling, nausea, vomiting, diarrhea, constipation, abdominal pain, melena, hematochezia, indigestion/heartburn, hematuria, incontinence, dysuria, vaginal bleeding, discharge, odor or itch, genital lesions, joint pains, numbness, tingling, weakness, tremor, suspicious skin lesions, depression, anxiety, abnormal bleeding/bruising, or enlarged lymph nodes.  Arthritis in feet, fingers and neck. Moods are better. [place] [facility] last weekend without any problems.  PHYSICAL EXAM: BP 150/98  Pulse 88  Ht 5\' 3"  (1.6 m)  Wt 127 lb (57.607 kg)  BMI 22.50 kg/m2 134/76 on repeat by MD 131/70 with pt's BP machine, when her arm was supported to heart level (same as when done with office cuff) General Appearance:    Alert, cooperative, no distress, appears stated age  Head:    Normocephalic, without obvious abnormality, atraumatic  Eyes:    PERRL, conjunctiva/corneas clear, EOM's intact, fundi    benign  Ears:    Normal TM's and external ear canals  Nose:   Nares normal, mucosa normal, no drainage or sinus   tenderness  Throat:   Lips, mucosa, and tongue normal; teeth and gums normal  Neck:   Supple, no lymphadenopathy;  thyroid:  no   enlargement/tenderness/nodules; no carotid   bruit or JVD  Back:    Spine nontender, no curvature, ROM normal, no CVA     tenderness  Lungs:     Clear to auscultation bilaterally without wheezes, rales or     ronchi; respirations unlabored  Chest Wall:    No tenderness or deformity   Heart:    Regular rate and rhythm, S1 and S2 normal, no murmur, rub   or gallop  Breast Exam:     Deferred to GYN  Abdomen:     Soft, non-tender, nondistended, normoactive bowel sounds,    no masses, no hepatosplenomegaly  Genitalia:    Deferred to GYN     Extremities:   No clubbing, cyanosis or edema  Pulses:   2+ and symmetric all extremities  Skin:   Skin color, texture, turgor normal, no rashes or lesions. SK right hand/wrist (pt pointed out as area of concern, that her derm has also reassured her was benign)  Lymph nodes:   Cervical, supraclavicular, and axillary nodes normal  Neurologic:   CNII-XII intact, normal strength, sensation and gait; reflexes 2+ and symmetric throughout  Psych:   Normal mood, affect, hygiene and grooming.      Chemistry      Component Value Date/Time   NA 141 11/02/2013 0850   K 4.5 11/02/2013 0850   CL 109 11/02/2013 0850   CO2 20 11/02/2013 0850   BUN 19 11/02/2013 0850   CREATININE 0.75 11/02/2013 0850      Component Value Date/Time   CALCIUM 8.9 11/02/2013 0850   ALKPHOS 88 11/02/2013 0850   AST 20 11/02/2013 0850   ALT 17 11/02/2013 0850   BILITOT 0.3 11/02/2013 0850     Glucose 84  Lab Results  Component Value Date   WBC 7.1 11/02/2013   HGB 14.5 11/02/2013   HCT 42.4 11/02/2013   MCV 97.5 11/02/2013   PLT 274 11/02/2013   Lab Results  Component Value Date   TSH 2.778 11/02/2013   Lab Results  Component Value Date   CHOL 189 11/02/2013   HDL 56 11/02/2013   LDLCALC 95 11/02/2013   TRIG 189* 11/02/2013   CHOLHDL 3.4 11/02/2013    ASSESSMENT/PLAN:  Routine general medical examination at a health care facility - Plan: Visual acuity screening, POCT Urinalysis Dipstick  Pure hypercholesterolemia - encouraged lowfat diet, increase fish oil.  Continue lipitor. - Plan: atorvastatin (LIPITOR) 10 MG tablet  Elevated blood pressure - normal on repeat. Possibly related to technique. continue monitoring and low sodium diet  Need for Tdap vaccination - Plan: Tdap vaccine greater than or equal to 7yo IM  Osteoporosis, unspecified - Plan:  ibandronate (BONIVA) 150 MG tablet, DISCONTINUED: ibandronate (BONIVA) 150 MG tablet  Colon cancer screening - Plan: CT Virtual Colonoscopy Screening   Discussed monthly self breast exams and yearly mammograms; at least 30 minutes of aerobic activity at least 5 days/week; proper sunscreen use reviewed; healthy diet, including goals of calcium and vitamin D intake and alcohol recommendations (less than or equal to 1 drink/day) reviewed; regular seatbelt use; changing batteries in smoke detectors.  Immunization recommendations discussed--Tdap given; recommended zostavax.  Colonoscopy recommendations reviewed--strongly encouraged.  She declines, due to knowing too many people who had complications/rupture.  Willing to have virtual colonoscopy.  Will refer, see if insurance will cover.  TdaP today--pt informed and aware that her insurance likely will not cover this, will to pay. Discussed the reasons for recommending pertussis vaccine, and tetanus booster (has been about 10 years). Zostavax--recommended.  Risks/benefits reviewed.  To check with insurance and get from pharmacy (if insurance requires that).  Osteoporosis--reviewed risks and side effects of meds.  Would prefer once monthly medication.  I am not familiar with OsteoStrong. Happy to review additional information if she provides, but still encourage calcium, vitamin D, weight-bearing exercise and bisphosphonate.  Repeat DEXA in 2 years  F/u 2 months if BP's remain >140/90, otherwise 6 months for med check. 60 min visit today (PE and med check, with at least 20 mins counseling)

## 2013-12-01 ENCOUNTER — Encounter: Payer: Self-pay | Admitting: Gynecology

## 2014-07-23 ENCOUNTER — Other Ambulatory Visit: Payer: Self-pay | Admitting: Family Medicine

## 2014-07-26 ENCOUNTER — Telehealth: Payer: Self-pay | Admitting: *Deleted

## 2014-07-26 ENCOUNTER — Other Ambulatory Visit: Payer: Self-pay | Admitting: *Deleted

## 2014-07-26 DIAGNOSIS — E782 Mixed hyperlipidemia: Secondary | ICD-10-CM

## 2014-07-26 DIAGNOSIS — Z79899 Other long term (current) drug therapy: Secondary | ICD-10-CM

## 2014-07-26 NOTE — Telephone Encounter (Signed)
Lipid and LFT; dx 272.2, v58.69

## 2014-07-26 NOTE — Telephone Encounter (Signed)
Refill request came in for her cholesterol medication, she was due for med check ~05/05/14. Called her to schedule and she will OOT starting tomorrow until Aug 27th. Scheduled her med check 09/02/14 and she would like to come in for fasting labs 08/30/14-need to know what labs and I will order.

## 2014-08-30 ENCOUNTER — Other Ambulatory Visit: Payer: Managed Care, Other (non HMO)

## 2014-08-30 ENCOUNTER — Other Ambulatory Visit: Payer: Self-pay | Admitting: Family Medicine

## 2014-08-30 DIAGNOSIS — Z79899 Other long term (current) drug therapy: Secondary | ICD-10-CM

## 2014-08-30 DIAGNOSIS — E782 Mixed hyperlipidemia: Secondary | ICD-10-CM

## 2014-08-31 LAB — HEPATIC FUNCTION PANEL
ALT: 103 U/L — ABNORMAL HIGH (ref 0–35)
AST: 64 U/L — AB (ref 0–37)
Albumin: 3.9 g/dL (ref 3.5–5.2)
Alkaline Phosphatase: 74 U/L (ref 39–117)
BILIRUBIN INDIRECT: 0.5 mg/dL (ref 0.2–1.2)
BILIRUBIN TOTAL: 0.6 mg/dL (ref 0.2–1.2)
Bilirubin, Direct: 0.1 mg/dL (ref 0.0–0.3)
Total Protein: 6.7 g/dL (ref 6.0–8.3)

## 2014-08-31 LAB — LIPID PANEL
Cholesterol: 153 mg/dL (ref 0–200)
HDL: 51 mg/dL (ref >39)
LDL Cholesterol: 74 mg/dL (ref 0–99)
Total CHOL/HDL Ratio: 3 Ratio
Triglycerides: 138 mg/dL (ref <150)
VLDL: 28 mg/dL (ref 0–40)

## 2014-09-01 ENCOUNTER — Ambulatory Visit (INDEPENDENT_AMBULATORY_CARE_PROVIDER_SITE_OTHER): Payer: Managed Care, Other (non HMO) | Admitting: Family Medicine

## 2014-09-01 ENCOUNTER — Encounter: Payer: Self-pay | Admitting: Family Medicine

## 2014-09-01 VITALS — BP 134/80 | HR 80 | Ht 63.0 in | Wt 126.0 lb

## 2014-09-01 DIAGNOSIS — R945 Abnormal results of liver function studies: Secondary | ICD-10-CM

## 2014-09-01 DIAGNOSIS — G4700 Insomnia, unspecified: Secondary | ICD-10-CM

## 2014-09-01 DIAGNOSIS — E7800 Pure hypercholesterolemia, unspecified: Secondary | ICD-10-CM

## 2014-09-01 DIAGNOSIS — IMO0001 Reserved for inherently not codable concepts without codable children: Secondary | ICD-10-CM

## 2014-09-01 DIAGNOSIS — R7989 Other specified abnormal findings of blood chemistry: Secondary | ICD-10-CM

## 2014-09-01 DIAGNOSIS — R030 Elevated blood-pressure reading, without diagnosis of hypertension: Secondary | ICD-10-CM

## 2014-09-01 DIAGNOSIS — M810 Age-related osteoporosis without current pathological fracture: Secondary | ICD-10-CM

## 2014-09-01 LAB — HEPATITIS PANEL, ACUTE
HCV Ab: NEGATIVE
Hep A IgM: NONREACTIVE
Hep B C IgM: NONREACTIVE
Hepatitis B Surface Ag: NEGATIVE

## 2014-09-01 MED ORDER — ZOLPIDEM TARTRATE 10 MG PO TABS: 5.0000 mg | ORAL_TABLET | Freq: Every evening | ORAL | Status: DC | PRN

## 2014-09-01 NOTE — Progress Notes (Signed)
Subjective:     Patient ID: DIA DONATE, female   DOB: [birth year], 74 y.o.   MRN: [id number]  HELEN WINTERHALTER presents for Hyperlipidemia  Chief Complaint  Patient presents with  . Hyperlipidemia    nonfasting med check, labs already done. Would like to know if you could refill her Lorrin Mais, as she needs it when she travels.    Hyperlipidemia follow-up:  Patient is reportedly following a low-fat, low cholesterol diet.  Compliant with medications and denies medication side effects  H/o elevated blood pressures. She periodically checks BP's at home (about once a week).  Last week it was 129/68.  She denies headaches, dizziness, chest pain. Denies shortness of breath, edema.   Osteoporosis:  She was started on Boniva at her last visit, 10/2013.  She denies any side effects.  She got a different generic last month, and had bad nausea about 5 days after taking it.  She was in [place] and wasn't sure if it could have been a virus.  It only lasted for one day.  She never had abdominal pain.  She is due for second pill next week (so will pay attention to see if any symptoms recur). Last DEXA was 09/2012  Pt was to check with insurance re: shingles vaccine. She has not done so.  She has had shingles, and isn't particularly interested in the vaccine.  It is a little over a year since she lost her son to suicide.  She is planning on moving, going through the boxes in her house.  She is having some anxiety, but doing okay overall.    She is asking for refill of ambien for when she flies on airplanes.  She took 1/2 tablet and it worked well.  Past Medical History  Diagnosis Date  . Hyperlipidemia elevated chol  . Vitamin D deficiency   . Osteoporosis 10/09/2012     T score -2.8  . Allergy   . Skin cancer     Dr. Nevada Crane  . Shingles 2010, 08/2011   Past Surgical History  Procedure Laterality Date  . Septoplasty     History   Social History  . Marital Status: Widowed    Spouse Name: N/A   Number of Children: 3  . Years of Education: N/A   Occupational History  . Retired Surveyor, mining    Social History Main Topics  . Smoking status: Never Smoker   . Smokeless tobacco: Never Used  . Alcohol Use: Yes     Comment: socially, glass of wine 5 times a week  . Drug Use: No  . Sexual Activity: No   Other Topics Concern  . Not on file   Social History Narrative   Widowed, lives alone. One daughter lives in [place], son in [place], 4 grandchildren.  Her son used to live with her, committed suicide summer 2014    Outpatient Encounter Prescriptions as of 09/01/2014  Medication Sig  . aspirin 81 MG tablet Take 81 mg by mouth daily.    Marland Kitchen atorvastatin (LIPITOR) 10 MG tablet TAKE ONE TABLET BY MOUTH ONE TIME DAILY   . Calcium Carbonate-Vitamin D (CALCIUM-VITAMIN D) 500-200 MG-UNIT per tablet Take 1 tablet by mouth daily.    Marland Kitchen co-enzyme Q-10 30 MG capsule Take 100 mg by mouth daily.   . fish oil-omega-3 fatty acids 1000 MG capsule Take 1 g by mouth daily.    Marland Kitchen ibandronate (BONIVA) 150 MG tablet Take in the morning monthly with a full glass of  water, on empty stomach; do not eat or lie down for 30 minutes.  . Multiple Vitamins-Minerals (MULTIVITAMIN WITH MINERALS) tablet Take 1 tablet by mouth daily.    Marland Kitchen zolpidem (AMBIEN) 10 MG tablet Take 0.5-1 tablets (5-10 mg total) by mouth at bedtime as needed for sleep.   Allergies  Allergen Reactions  . Sulfa Antibiotics Rash    Review of Systems  Constitutional: Negative.   HENT: Negative.   Respiratory: Negative.   Cardiovascular: Negative.   Gastrointestinal: Negative.   Musculoskeletal: Negative.   Skin: Negative.   Allergic/Immunologic: Negative.   Neurological: Negative.   Hematological: Negative.   Psychiatric/Behavioral: Negative for dysphoric mood. The patient is nervous/anxious.    Some pain related to arthritis in neck and feet     Objective:     BP 134/80  Pulse 80  Ht 5\' 3"  (1.6 m)  Wt 126 lb (57.153 kg)  BMI  22.33 kg/m2  Physical Exam  Constitutional: She is oriented to person, place, and time. She appears well-developed and well-nourished.  HENT:  Head: Normocephalic and atraumatic.  Mouth/Throat: Oropharynx is clear and moist.  Eyes: Conjunctivae are normal. Pupils are equal, round, and reactive to light.  Neck: Normal range of motion. Neck supple. No thyromegaly present.  Cardiovascular: Normal rate, regular rhythm, normal heart sounds and intact distal pulses.   Pulmonary/Chest: Effort normal and breath sounds normal.  Abdominal: Soft. Bowel sounds are normal. She exhibits no distension. There is no tenderness.  No hepatomegaly, negative Murphy sign  Lymphadenopathy:    She has no cervical adenopathy.  Neurological: She is alert and oriented to person, place, and time.  Skin: Skin is warm and dry.  Psychiatric: She has a normal mood and affect. Her behavior is normal. Judgment and thought content normal.  Very dry skin on legs bilaterally    Lab Results  Component Value Date   CHOL 153 08/30/2014   HDL 51 08/30/2014   LDLCALC 74 08/30/2014   TRIG 138 08/30/2014   CHOLHDL 3.0 08/30/2014   Lab Results  Component Value Date   ALT 103* 08/30/2014   AST 64* 08/30/2014   ALKPHOS 74 08/30/2014   BILITOT 0.6 08/30/2014       Assessment and Plan        Pure hypercholesterolemia - very well controlled on 10mg  atorvastatin.  Will have her hold this temporarily while evaluating elevated LFT's  Elevated blood pressure - normal at home.  continue periodic monitoring  Osteoporosis, unspecified - continue generic Boniva.  Call next week if has any kind of reaction to 2nd dose of new generic form  Elevated LFTs - unclear etiology. Add on hepatitis panel.  recheck in 2 wks.  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  Recheck DEXA in 1-2 years (only started on meds 10/2013).  Reviewed risks/benefits of shingles vaccine, recommended. Likely needs to get from pharmacy, and will call us for order when desired.   Add Hepatitis panel to labs. Hold lipitor and recheck in 2 weeks.  Avoid alcohol, tylenol and anti-inflammatories. If still elevated in 2 weeks, likely will need further eval, including imaging.   F/u CPE in 6 months; repeat LFT's in 2 weeks.

## 2014-09-01 NOTE — Patient Instructions (Signed)
Your liver tests were elevated today. We are doing some additional tests, and plan to recheck in 2 weeks.  Please avoid all alcohol, tylenol and other pain relievers including ibuprofen, aleve.  If they remain elevated, we will need to further evaluate the cause (including imaging tests)

## 2014-09-21 ENCOUNTER — Other Ambulatory Visit: Payer: Managed Care, Other (non HMO)

## 2014-09-21 DIAGNOSIS — R945 Abnormal results of liver function studies: Principal | ICD-10-CM

## 2014-09-21 DIAGNOSIS — R7989 Other specified abnormal findings of blood chemistry: Secondary | ICD-10-CM

## 2014-09-22 LAB — HEPATIC FUNCTION PANEL
ALK PHOS: 82 U/L (ref 39–117)
ALT: 57 U/L — ABNORMAL HIGH (ref 0–35)
AST: 36 U/L (ref 0–37)
Albumin: 4 g/dL (ref 3.5–5.2)
BILIRUBIN TOTAL: 0.3 mg/dL (ref 0.2–1.2)
Bilirubin, Direct: 0.1 mg/dL (ref 0.0–0.3)
Indirect Bilirubin: 0.2 mg/dL (ref 0.2–1.2)
Total Protein: 6.8 g/dL (ref 6.0–8.3)

## 2014-09-27 ENCOUNTER — Other Ambulatory Visit: Payer: Self-pay | Admitting: *Deleted

## 2014-09-27 DIAGNOSIS — R7989 Other specified abnormal findings of blood chemistry: Secondary | ICD-10-CM

## 2014-09-27 DIAGNOSIS — R945 Abnormal results of liver function studies: Principal | ICD-10-CM

## 2014-10-28 ENCOUNTER — Other Ambulatory Visit: Payer: Managed Care, Other (non HMO)

## 2014-10-28 ENCOUNTER — Telehealth: Payer: Self-pay | Admitting: *Deleted

## 2014-10-28 DIAGNOSIS — R945 Abnormal results of liver function studies: Principal | ICD-10-CM

## 2014-10-28 DIAGNOSIS — R7989 Other specified abnormal findings of blood chemistry: Secondary | ICD-10-CM

## 2014-10-28 LAB — HEPATIC FUNCTION PANEL
ALK PHOS: 83 U/L (ref 39–117)
ALT: 46 U/L — ABNORMAL HIGH (ref 0–35)
AST: 36 U/L (ref 0–37)
Albumin: 3.7 g/dL (ref 3.5–5.2)
Bilirubin, Direct: 0.1 mg/dL (ref 0.0–0.3)
Indirect Bilirubin: 0.3 mg/dL (ref 0.2–1.2)
TOTAL PROTEIN: 6.6 g/dL (ref 6.0–8.3)
Total Bilirubin: 0.4 mg/dL (ref 0.2–1.2)

## 2014-10-28 NOTE — Telephone Encounter (Signed)
She can try taking something (like omeprazole or zantac) the day before, to try and eliminate symptoms.  Or she can continue Tums prn.  Symptoms usually only last 1-2 days out of the month (so the monthly med is a better choice than a weekly med).  Other options include Reclast infusion, which is a once yearly infusion (of same type of med)

## 2014-10-28 NOTE — Telephone Encounter (Signed)
Left message for patient to return my call.

## 2014-10-28 NOTE — Telephone Encounter (Signed)
Patient was in for labs this morning and told me that she thinks that her Holly Russo is causing heartburn. She has taken TUMS and this helps, doe snot desire to take anything else such as omeprazole. Wonders if there is something else that she can take instead of the Boniva?

## 2014-11-01 ENCOUNTER — Encounter: Payer: Self-pay | Admitting: Family Medicine

## 2014-11-01 NOTE — Telephone Encounter (Signed)
Patient informed. 

## 2014-11-05 ENCOUNTER — Other Ambulatory Visit: Payer: Self-pay | Admitting: Family Medicine

## 2015-01-03 ENCOUNTER — Encounter: Payer: Self-pay | Admitting: Family Medicine

## 2015-01-03 ENCOUNTER — Ambulatory Visit (INDEPENDENT_AMBULATORY_CARE_PROVIDER_SITE_OTHER): Payer: Managed Care, Other (non HMO) | Admitting: Family Medicine

## 2015-01-03 ENCOUNTER — Telehealth: Payer: Self-pay | Admitting: Internal Medicine

## 2015-01-03 VITALS — BP 142/78 | HR 80 | Ht 63.0 in | Wt 130.0 lb

## 2015-01-03 DIAGNOSIS — I10 Essential (primary) hypertension: Secondary | ICD-10-CM

## 2015-01-03 DIAGNOSIS — J309 Allergic rhinitis, unspecified: Secondary | ICD-10-CM

## 2015-01-03 MED ORDER — IBANDRONATE SODIUM 150 MG PO TABS: ORAL_TABLET | ORAL | Status: DC

## 2015-01-03 MED ORDER — ATORVASTATIN CALCIUM 10 MG PO TABS: 10.0000 mg | ORAL_TABLET | Freq: Every day | ORAL | Status: DC

## 2015-01-03 MED ORDER — LOSARTAN POTASSIUM 25 MG PO TABS: 25.0000 mg | ORAL_TABLET | Freq: Every day | ORAL | Status: DC

## 2015-01-03 NOTE — Progress Notes (Signed)
Chief Complaint  Patient presents with  . Hypertension    over the last month-making her feel "spacey." Brought in list today.    Patient has h/o elevated BP's.  She has been monitoring at home, and presents to discuss.  She felt "spacey" yesterday, all day.  BP was 151/89.  She felt light-headed three or four times last week.  Very short-lived, lasted just a few seconds. Denies any rotational component or feeling like equilibrium was off.  Didn't feel like she was going to pass out.  Yesterday she was "floaty", had trouble further describing "light headed.  Denies any associated chest pain, palpitations, tachycardia, headache.  She has a very dull headache today--thinks it might be from her sinuses.  Had congestion this morning.  BP's have been checked since September, and varies from 130's/70's up to 174/90 on 1/1.  Mostly fluctuate between 137-169/74-98 in December.  Denies any change in diet--denies ham, soup, chinese food or soy sauce.  She hasn't been walking as much due to the rainy weather.  Not using decongestants or sinus medications.  Denies any significant stressors.  She will be leaving to drive to [facility] in February. +FH HTN--younger sister.  Elevated LFT's--had been improving, last checked 10/29.  Recommended to recheck in 3 months (she hasn't scheduled visit yet). She restarted lipitor 10mg  after last recheck.  Lipids were last checked 8/31, on the lipitor, and were good (that is when elevated LFT's were noted, and statin was held until repeated).  PMH, PSH, SH reviewed.  Current Outpatient Prescriptions on File Prior to Visit  Medication Sig Dispense Refill  . aspirin 81 MG tablet Take 81 mg by mouth daily.      Marland Kitchen atorvastatin (LIPITOR) 10 MG tablet Take 1 tablet (10 mg total) by mouth daily. 90 tablet 0  . Calcium Carbonate-Vitamin D (CALCIUM-VITAMIN D) 500-200 MG-UNIT per tablet Take 1 tablet by mouth daily.      Marland Kitchen co-enzyme Q-10 30 MG capsule Take 100 mg by mouth daily.     .  fish oil-omega-3 fatty acids 1000 MG capsule Take 1 g by mouth daily.      Marland Kitchen ibandronate (BONIVA) 150 MG tablet Take 1 tablet by mouth in the morning monthly with a full glass of water, on empty stomach; Do not eat or lie down for 30 minutes after taki 1 tablet 2  . Multiple Vitamins-Minerals (MULTIVITAMIN WITH MINERALS) tablet Take 1 tablet by mouth daily.      Marland Kitchen zolpidem (AMBIEN) 10 MG tablet Take 0.5-1 tablets (5-10 mg total) by mouth at bedtime as needed for sleep. (Patient not taking: Reported on 01/03/2015) 20 tablet 0   No current facility-administered medications on file prior to visit.   Allergies  Allergen Reactions  . Sulfa Antibiotics Rash   ROS:  No fevers, chills, chest pain, palpitations, shortness of breath, edema, nausea, vomiting ,cough.  Having some sinus congestion today. No bleeding, bruising, rash.  Moods are good. Arthritis in hands, feet.  Denies numbness/tingling, tremor.  PHYSICAL EXAM: BP 162/96 mmHg  Pulse 80  Ht 5\' 3"  (1.6 m)  Wt 130 lb (58.968 kg)  BMI 23.03 kg/m2 142/78 on repeat by MD, RA.  This was with large cuff--didn't fit well (too big) Rechecked with regular size cuff and was 148/78 on both arms with this cuff. Well developed, pleasant female in no distress HEENT: PERRL, EOMI, conjunctiva clear.  R TM partly obscured by cerumen.  Normal TM and EAC on the left. Nasal mucosa is  mild-moderately edematous, clear mucus.  Sinuses are nontender. OP is normal Neck: no lymphadenopathy, thyromegaly or carotid bruit Heart: regular rate and rhythm without murmur, rub, gallop Lungs: clear bilaterally Extremities: no edema, 2+ pulses Psych: flat affect, normal mood Neuro: alert and oriented.  Cranial nerves 2-12 intact.  Normal finger to nose, no pronator drift.  Strength 5/5, normal sensation. DTR's 2+ and symmetric. Normal gait  ASSESSMENT/PLAN:  Essential hypertension - start losartan 25mg . cont low sodium diet, regular exercise and monitoring of BP.  - Plan:  losartan (COZAAR) 25 MG tablet  Allergic rhinitis, unspecified allergic rhinitis type - may be contributing to her dizziness, (rather than due to her BP). OTC meds reviewed   Hypertension Start losartan 25mg  once daily.  If blood pressure drops to <110/60, you may cut the tablets in half. Continue to monitor the blood pressure, and also keep "comments" section on your paper--write if feeling dizzy, pain, etc. Continue to limit the sodium in your diet, and try and exercise daily.   You likely should also treat your allergies, as this may be contributing to your "spaciness". You can use claritin or zyrtec or flonase.  Return in 3 weeks with your list of blood pressures.  We will be checking your blood at that time (to recheck liver, as well as check kidney and potassium level from the new medication). Call sooner if you are having side effects or problems to the new medication.

## 2015-01-03 NOTE — Telephone Encounter (Signed)
Refill for boniva and atorvastatin to [facility]

## 2015-01-03 NOTE — Telephone Encounter (Signed)
Done

## 2015-01-03 NOTE — Patient Instructions (Signed)
  Hypertension Start losartan 25mg  once daily.  If blood pressure drops to <110/60, you may cut the tablets in half. Continue to monitor the blood pressure, and also keep "comments" section on your paper--write if feeling dizzy, pain, etc. Continue to limit the sodium in your diet, and try and exercise daily.   You likely should also treat your allergies, as this may be contributing to your "spaciness". You can use claritin or zyrtec or flonase.  Return in 3 weeks with your list of blood pressures.  We will be checking your blood at that time (to recheck liver, as well as check kidney and potassium level from the new medication). Call sooner if you are having side effects or problems to the new medication.

## 2015-01-24 ENCOUNTER — Ambulatory Visit: Payer: Managed Care, Other (non HMO) | Admitting: Family Medicine

## 2015-01-26 ENCOUNTER — Telehealth: Payer: Self-pay | Admitting: Family Medicine

## 2015-01-26 DIAGNOSIS — E7800 Pure hypercholesterolemia, unspecified: Secondary | ICD-10-CM

## 2015-01-26 DIAGNOSIS — Z0000 Encounter for general adult medical examination without abnormal findings: Secondary | ICD-10-CM

## 2015-01-26 DIAGNOSIS — I10 Essential (primary) hypertension: Secondary | ICD-10-CM

## 2015-01-26 DIAGNOSIS — Z5181 Encounter for therapeutic drug level monitoring: Secondary | ICD-10-CM

## 2015-01-26 NOTE — Telephone Encounter (Signed)
Future orders entered.  She was supposed to have been scheduled for a physical--I'm assuming the visit next week is going to just be a med check, unless there was a cancellation.  She does need to schedule physical at some point.

## 2015-01-26 NOTE — Telephone Encounter (Signed)
Pt was scheduled to see Dr Tomi Bamberger and get labs on 1/25 but cancelled. Pt wants to come in for fasting labs only on Friday 1/29 and then come in to see Dr Tomi Bamberger next week. Is this OK?

## 2015-01-27 ENCOUNTER — Telehealth: Payer: Self-pay | Admitting: Family Medicine

## 2015-01-27 LAB — HM MAMMOGRAPHY: HM MAMMO: NORMAL

## 2015-01-27 NOTE — Telephone Encounter (Signed)
Tried to call patient and could not get a hold of her.

## 2015-01-27 NOTE — Telephone Encounter (Signed)
Pt called and said she was returning someone's call so she could come in the morning to get labs drawns.  I reviewed the chart and explained what Dr. Pascal Lux comments were that she also needed appointments.  She said all she wants is to come in and get her labs.  I again said an appt with Dr. Tomi Bamberger will also be necessary and she was not happy about that.  She states she cannot fast past the 8:30

## 2015-01-27 NOTE — Telephone Encounter (Signed)
Per last visit a few weeks ago, she was supposed to have OV, and nonfasting labs can be done at visit. That is what her 1/25 appointment was supposed to be for.  She is the one who called and asked to come for fasting labs Friday, and be seen by me the following week.  The purpose of the visit is to f/u on her blood pressure since new med was started, so she must come for visit.  The labs do not need to be fasting, and can be drawn at her visit.  She was also supposed to have scheduled a physical, and the labs that were entered as future orders can be done prior to physical--she does not have to have these done now.

## 2015-01-27 NOTE — Telephone Encounter (Signed)
Called pt and LM to call to schedule appt's as mentioned by Dr Tomi Bamberger

## 2015-01-28 ENCOUNTER — Other Ambulatory Visit: Payer: Managed Care, Other (non HMO)

## 2015-01-28 DIAGNOSIS — I10 Essential (primary) hypertension: Secondary | ICD-10-CM

## 2015-01-28 DIAGNOSIS — Z5181 Encounter for therapeutic drug level monitoring: Secondary | ICD-10-CM

## 2015-01-28 DIAGNOSIS — Z0000 Encounter for general adult medical examination without abnormal findings: Secondary | ICD-10-CM

## 2015-01-28 DIAGNOSIS — E7800 Pure hypercholesterolemia, unspecified: Secondary | ICD-10-CM

## 2015-01-28 LAB — CBC WITH DIFFERENTIAL/PLATELET
Basophils Absolute: 0.1 10*3/uL (ref 0.0–0.1)
Basophils Relative: 1 % (ref 0–1)
Eosinophils Absolute: 0.1 10*3/uL (ref 0.0–0.7)
Eosinophils Relative: 2 % (ref 0–5)
HEMATOCRIT: 43.8 % (ref 36.0–46.0)
Hemoglobin: 14.4 g/dL (ref 12.0–15.0)
LYMPHS ABS: 2.3 10*3/uL (ref 0.7–4.0)
Lymphocytes Relative: 35 % (ref 12–46)
MCH: 32.1 pg (ref 26.0–34.0)
MCHC: 32.9 g/dL (ref 30.0–36.0)
MCV: 97.8 fL (ref 78.0–100.0)
MONO ABS: 0.7 10*3/uL (ref 0.1–1.0)
MONOS PCT: 10 % (ref 3–12)
MPV: 10.1 fL (ref 8.6–12.4)
NEUTROS PCT: 52 % (ref 43–77)
Neutro Abs: 3.5 10*3/uL (ref 1.7–7.7)
Platelets: 242 10*3/uL (ref 150–400)
RBC: 4.48 MIL/uL (ref 3.87–5.11)
RDW: 13.2 % (ref 11.5–15.5)
WBC: 6.7 10*3/uL (ref 4.0–10.5)

## 2015-01-28 LAB — COMPREHENSIVE METABOLIC PANEL
ALT: 35 U/L (ref 0–35)
AST: 30 U/L (ref 0–37)
Albumin: 3.8 g/dL (ref 3.5–5.2)
Alkaline Phosphatase: 85 U/L (ref 39–117)
BUN: 15 mg/dL (ref 6–23)
CHLORIDE: 106 meq/L (ref 96–112)
CO2: 25 mEq/L (ref 19–32)
Calcium: 9.3 mg/dL (ref 8.4–10.5)
Creat: 0.84 mg/dL (ref 0.50–1.10)
Glucose, Bld: 73 mg/dL (ref 70–99)
Potassium: 4.5 mEq/L (ref 3.5–5.3)
SODIUM: 141 meq/L (ref 135–145)
Total Bilirubin: 0.4 mg/dL (ref 0.2–1.2)
Total Protein: 6.8 g/dL (ref 6.0–8.3)

## 2015-01-28 LAB — LIPID PANEL
CHOL/HDL RATIO: 2.9 ratio
CHOLESTEROL: 187 mg/dL (ref 0–200)
HDL: 65 mg/dL (ref >39)
LDL Cholesterol: 96 mg/dL (ref 0–99)
Triglycerides: 131 mg/dL (ref <150)
VLDL: 26 mg/dL (ref 0–40)

## 2015-01-29 LAB — TSH: TSH: 2.976 u[IU]/mL (ref 0.350–4.500)

## 2015-02-02 ENCOUNTER — Encounter: Payer: Self-pay | Admitting: Family Medicine

## 2015-02-02 ENCOUNTER — Ambulatory Visit (INDEPENDENT_AMBULATORY_CARE_PROVIDER_SITE_OTHER): Payer: Managed Care, Other (non HMO) | Admitting: Family Medicine

## 2015-02-02 ENCOUNTER — Encounter: Payer: Self-pay | Admitting: *Deleted

## 2015-02-02 VITALS — BP 130/70 | HR 72 | Ht 63.0 in | Wt 129.0 lb

## 2015-02-02 DIAGNOSIS — R7989 Other specified abnormal findings of blood chemistry: Secondary | ICD-10-CM

## 2015-02-02 DIAGNOSIS — I10 Essential (primary) hypertension: Secondary | ICD-10-CM

## 2015-02-02 DIAGNOSIS — E7800 Pure hypercholesterolemia, unspecified: Secondary | ICD-10-CM

## 2015-02-02 DIAGNOSIS — R945 Abnormal results of liver function studies: Secondary | ICD-10-CM

## 2015-02-02 MED ORDER — LOSARTAN POTASSIUM 25 MG PO TABS: 25.0000 mg | ORAL_TABLET | Freq: Every day | ORAL | Status: DC

## 2015-02-02 NOTE — Progress Notes (Signed)
Chief Complaint  Patient presents with  . Hypertension    nonfasting med check, labs already done.    Patient presents to follow up on hypertension.  She was started on 25mg  of losartan at last visit, and is tolerating without side effects.  She has noted improvement in her blood pressures.  They are ranging from 127-149/69-78, mostly low 130's/mid-high 70's.  She has many questions.  She has not yet scheduled her physical--needs to see when she will be out of town. She had fasting labs prior to today's visit.  Questions re: recent mammo-not sure if she wants to continue at [facility]  Questions re: how often to have pap smears (she sees GYN) Questions re: NSAIDs for arthritis  PMH, PSH, SH reviewed.  Outpatient Encounter Prescriptions as of 02/02/2015  Medication Sig  . aspirin 81 MG tablet Take 81 mg by mouth daily.    Marland Kitchen atorvastatin (LIPITOR) 10 MG tablet Take 1 tablet (10 mg total) by mouth daily.  . Calcium Carbonate-Vitamin D (CALCIUM-VITAMIN D) 500-200 MG-UNIT per tablet Take 1 tablet by mouth daily.    Marland Kitchen co-enzyme Q-10 30 MG capsule Take 100 mg by mouth daily.   . fish oil-omega-3 fatty acids 1000 MG capsule Take 1 g by mouth daily.    Marland Kitchen ibandronate (BONIVA) 150 MG tablet Take 1 tablet by mouth in the morning monthly with a full glass of water, on empty stomach; Do not eat or lie down for 30 minutes after taki  . losartan (COZAAR) 25 MG tablet Take 1 tablet (25 mg total) by mouth daily.  . Multiple Vitamins-Minerals (MULTIVITAMIN WITH MINERALS) tablet Take 1 tablet by mouth daily.    . [DISCONTINUED] losartan (COZAAR) 25 MG tablet Take 1 tablet (25 mg total) by mouth daily.  Marland Kitchen zolpidem (AMBIEN) 10 MG tablet Take 0.5-1 tablets (5-10 mg total) by mouth at bedtime as needed for sleep. (Patient not taking: Reported on 01/03/2015)   Allergies  Allergen Reactions  . Sulfa Antibiotics Rash    ROS:  No headache, dizziness, chest pain, shortness of breath, cough, URI symptoms, edema, fever,  chills, GI complaints or other concerns.  PHYSICAL EXAM: BP 130/70 mmHg  Pulse 72  Ht 5\' 3"  (1.6 m)  Wt 129 lb (58.514 kg)  BMI 22.86 kg/m2  124/74 on repeat by MD Pleasant female in no distress Neck: no lymphadenopathy or mass Heart: regular rate and rhythm Lungs: clear bilaterally Abdomen: soft, nontender, no mass Extremities: no edema, 2+ pulse Neuro: alert and oriented.  Cranial nerves, strength, gait normal. Psych: normal mood, affect   Lab Results  Component Value Date   WBC 6.7 01/28/2015   HGB 14.4 01/28/2015   HCT 43.8 01/28/2015   MCV 97.8 01/28/2015   PLT 242 01/28/2015   Lab Results  Component Value Date   CHOL 187 01/28/2015   HDL 65 01/28/2015   LDLCALC 96 01/28/2015   TRIG 131 01/28/2015   CHOLHDL 2.9 01/28/2015   Lab Results  Component Value Date   TSH 2.976 01/28/2015     Chemistry      Component Value Date/Time   NA 141 01/28/2015 0841   K 4.5 01/28/2015 0841   CL 106 01/28/2015 0841   CO2 25 01/28/2015 0841   BUN 15 01/28/2015 0841   CREATININE 0.84 01/28/2015 0841      Component Value Date/Time   CALCIUM 9.3 01/28/2015 0841   ALKPHOS 85 01/28/2015 0841   AST 30 01/28/2015 0841   ALT 35 01/28/2015 0841  BILITOT 0.4 01/28/2015 0841     Glucose 73  ASSESSMENT/PLAN:  Essential hypertension - improved control.  tolerating without side effects; normal chem.  Continue - Plan: losartan (COZAAR) 25 MG tablet  Pure hypercholesterolemia - at goal  Elevated LFTs - resolved   Counseled extensively, all questions answered in detail re: routine screening recommendations for pap smears, pelvic exams. Discussed [facility] vs [facility]--I have no concerns re: their interpretation or ability to properly read mammograms, however would support her decision to change to [facility] if that is her preference.   Discussed treatments for arthritis pain--acetaminophen (limit amounts, just prn, giving recent elevated LFT's), vs NSAIDS Recent elevated  LFT's--resolved, and no recurrence since back on statin.  25 minute visit, more than 1/2 spent counseling, answering her various questions. She was encouraged to schedule CPE--no labs needed (already done). Periodically monitor BP.  Continue low sodium diet. Return if BP's rise, persistently >140/90.

## 2015-02-02 NOTE — Patient Instructions (Signed)
Continue your current medications. Check blood pressure 2-4 times/month. Contact us if they are persistently over 140/90.  They look great now.  Return within 6 months for a physical, sooner if other concerns arise.

## 2015-06-27 ENCOUNTER — Ambulatory Visit (INDEPENDENT_AMBULATORY_CARE_PROVIDER_SITE_OTHER): Payer: Medicare HMO | Admitting: Family Medicine

## 2015-06-27 ENCOUNTER — Encounter: Payer: Self-pay | Admitting: Family Medicine

## 2015-06-27 ENCOUNTER — Telehealth: Payer: Self-pay | Admitting: Family Medicine

## 2015-06-27 VITALS — BP 120/78 | HR 64 | Wt 127.8 lb

## 2015-06-27 DIAGNOSIS — B852 Pediculosis, unspecified: Secondary | ICD-10-CM | POA: Diagnosis not present

## 2015-06-27 MED ORDER — IVERMECTIN 0.5 % EX LOTN: TOPICAL_LOTION | CUTANEOUS | Status: DC

## 2015-06-27 NOTE — Telephone Encounter (Signed)
Please call, new Rx you gave today is $400 with her insurance, she wants to know if she can use OTC's

## 2015-06-27 NOTE — Telephone Encounter (Signed)
Initiated P.A. Sklice

## 2015-06-27 NOTE — Patient Instructions (Signed)
   Head and Pubic Lice °Lice are tiny, light brown insects with claws on the ends of their legs. They are small parasites that live on the human body. Lice often make their home in your hair. They hatch from little round eggs (nits), which are attached to the base of hairs. They spread by: °· Direct contact with an infested person. °· Infested personal items such as combs, brushes, towels, clothing, pillow cases and sheets. °The parasite that causes your condition may also live in clothes which have been worn within the week before treatment. Therefore, it is necessary to wash your clothes, bed linens, towels, combs and brushes. Any woolens can be put in an air-tight plastic bag for one week. You need to use fresh clothes, towels and sheets after your treatment is completed. Re-treatment is usually not necessary if instructions are followed. If necessary, treatment may be repeated in 7 days. The entire family may require treatment. Sexual partners should be treated if the nits are present in the pubic area. °TREATMENT °· Apply enough medicated shampoo or cream to wet hair and skin in and around the infected areas. °· Work thoroughly into hair and leave in according to instructions. °· Add a small amount of water until a good lather forms. °· Rinse thoroughly. °· Towel briskly. °· When hair is dry, any remaining nits, cream or shampoo may be removed with a fine-tooth comb or tweezers. The nits resemble dandruff; however they are glued to the hair follicle and are difficult to brush out. Frequent fine combing and shampoos are necessary. A towel soaked in white vinegar and left on the hair for 2 hours will also help soften the glue which holds the nits on the hair. °Medicated shampoo or cream should not be used on children or pregnant women without a caregiver's prescription or instructions. °SEEK MEDICAL CARE IF:  °· You or your child develops sores that look infected. °· The rash does not go away in one week. °· The  lice or nits return or persist in spite of treatment. °Document Released: 12/17/2005 Document Revised: 03/10/2012 Document Reviewed: 07/16/2007 °[employer]® Patient Information ©2015 [employer]. This information is not intended to replace advice given to you by your health care provider. Make sure you discuss any questions you have with your health care provider. ° °

## 2015-06-27 NOTE — Progress Notes (Signed)
Chief Complaint  Patient presents with  . head lice    head lice- tried otc RID-  Sklice not working. nothing is working- lice for the last 7 weeks.    She slept in the same bed with her granddaughter about 7-8 weeks ago.  A few days later she was notified that her granddaughter had head lice.  She used OTC RID and fine tooth comb. She then used Sklice the next week (prescription, sent to her by her son, who's daughter gave it to her--they required 3 treatments with Sklice, per pt).  The following week, she used Ultra-Nix OTC (6/16).   She hasn't been having any itching at all, but when using the fine tooth comb she is seeing little black things in the sink, like specks of pepper, as well as nits. Does not seeing any moving bugs.   She denies any itching to her scalp.  Only itched prior to her first treatment.    She has been combing her hair out at least 3-4 times daily, as she is paranoid. She doesn't have anybody else to help her, so she has done all the treatments and combing herself.  She is very anxious about still having lice. She plans to see her other grandchildren soon, and doesn't want to get them exposed.  PMH, PSH, SH reviewed.  Outpatient Encounter Prescriptions as of 06/27/2015  Medication Sig  . aspirin 81 MG tablet Take 81 mg by mouth daily.    Marland Kitchen atorvastatin (LIPITOR) 10 MG tablet Take 1 tablet (10 mg total) by mouth daily.  . Calcium Carbonate-Vitamin D (CALCIUM-VITAMIN D) 500-200 MG-UNIT per tablet Take 1 tablet by mouth daily.    Marland Kitchen co-enzyme Q-10 30 MG capsule Take 100 mg by mouth daily.   . fish oil-omega-3 fatty acids 1000 MG capsule Take 1 g by mouth daily.    Marland Kitchen losartan (COZAAR) 25 MG tablet Take 1 tablet (25 mg total) by mouth daily.  . Multiple Vitamins-Minerals (MULTIVITAMIN WITH MINERALS) tablet Take 1 tablet by mouth daily.    . [DISCONTINUED] ibandronate (BONIVA) 150 MG tablet Take 1 tablet by mouth in the morning monthly with a full glass of water, on empty  stomach; Do not eat or lie down for 30 minutes after taki  . Ivermectin (SKLICE) 0.5 % LOTN Apply to scalp and leave on for 10 minutes before rinsing.  Marland Kitchen zolpidem (AMBIEN) 10 MG tablet Take 0.5-1 tablets (5-10 mg total) by mouth at bedtime as needed for sleep. (Patient not taking: Reported on 01/03/2015)   No facility-administered encounter medications on file as of 06/27/2015.   Allergies  Allergen Reactions  . Sulfa Antibiotics Rash   ROS:  No fever, chills, URI symptoms, rash, bleeding, bruising, or other complaints.  PHYSICAL EXAM: BP 120/78 mmHg  Pulse 64  Wt 127 lb 12.8 oz (57.97 kg)  Well developed, but anxious female, in no distress  Full head/hair exam was performed (since she doesn't have anybody else to do this for her, and remove nits) 2 small excoriations noted on the right side of her scalp (parietal and posteriorly).  Benign nevus noted on the left posterior scalp. Only one nit was noted, posteriorly and slightly on the left--it actually dislodged from the hair fairly easily (with gloved finger, not requiring fine tooth comb or fingernails to remove, like usual). No live bugs or other nits noted elsewhere in scalp. No source of the black specks she describes finding anywhere on her scalp Scalp otherwise appeared normal (no  inflammation, redness or flaking). Skin: no rashes or irritation  ASSESSMENT/PLAN:  Lice - Plan: Ivermectin (SKLICE) 0.5 % LOTN   Lice--probably mostly well-treated, in that only 1 nit was noted, and she has NOT been having any itching.  Not sure what she is seeing every time she combs, but given that there was a nit today, will retreat.  Advised to comb the hair after treatment.  Advised to bring in the small, "pepper"-like black things she gets after combing her hair, so that we can see if it is truly lice (vs scab vs something else).  She returned later today with sample of the black specks.  They varied in size (some smaller than others).  They  were examined under the microscope and were NOT lice or any type of insect.  Called patient and reassured her.   She also stated that the [facility] was $718.  Advised that she can use another course of OTC shampoo for re-treatment (she prefers an additional treatment rather than waiting).  25 min visit, more than 1/2 spent counseling (and included microscopic exam of specimen brought in later, and the nit removal/scalp inspection).

## 2015-06-28 ENCOUNTER — Other Ambulatory Visit: Payer: Self-pay | Admitting: Family Medicine

## 2015-06-28 NOTE — Telephone Encounter (Signed)
We changed her med.  Stop the auth for [facility]

## 2015-06-28 NOTE — Telephone Encounter (Signed)
P.A. SKLICE approved but even with insurance is $244.77, pt informed, she will just go with the OTC

## 2015-06-29 NOTE — Telephone Encounter (Signed)
P.A. SKLICE approved but even with insurance is $244.77, pt informed, she will just go with the OTC

## 2015-08-12 ENCOUNTER — Other Ambulatory Visit: Payer: Self-pay | Admitting: Family Medicine

## 2015-08-12 NOTE — Telephone Encounter (Signed)
Mailbox full-patient needs to schedule CPE ASAP. Will fill rx but I will ask pharmacy to please have her contact us.

## 2015-10-19 DIAGNOSIS — Z23 Encounter for immunization: Secondary | ICD-10-CM | POA: Diagnosis not present

## 2015-10-25 ENCOUNTER — Encounter: Payer: Self-pay | Admitting: Gynecology

## 2015-10-25 ENCOUNTER — Ambulatory Visit (INDEPENDENT_AMBULATORY_CARE_PROVIDER_SITE_OTHER): Payer: Medicare HMO | Admitting: Gynecology

## 2015-10-25 ENCOUNTER — Other Ambulatory Visit (HOSPITAL_COMMUNITY)
Admission: RE | Admit: 2015-10-25 | Discharge: 2015-10-25 | Disposition: A | Discharge status: Home / Self Care | Payer: Medicare HMO | Source: Ambulatory Visit | Attending: Gynecology | Admitting: Gynecology

## 2015-10-25 VITALS — BP 124/76 | Ht 63.0 in | Wt 130.0 lb

## 2015-10-25 DIAGNOSIS — Z124 Encounter for screening for malignant neoplasm of cervix: Secondary | ICD-10-CM

## 2015-10-25 DIAGNOSIS — Z01419 Encounter for gynecological examination (general) (routine) without abnormal findings: Secondary | ICD-10-CM

## 2015-10-25 DIAGNOSIS — M810 Age-related osteoporosis without current pathological fracture: Secondary | ICD-10-CM | POA: Diagnosis not present

## 2015-10-25 DIAGNOSIS — N952 Postmenopausal atrophic vaginitis: Secondary | ICD-10-CM

## 2015-10-25 NOTE — Addendum Note (Signed)
Addended by: Nelva Nay on: 10/25/2015 04:08 PM   Modules accepted: Orders

## 2015-10-25 NOTE — Progress Notes (Signed)
Holly Russo 20-Nov-1940 [id number]        75 y.o.  G1P1001 for breast and pelvic exam. Several issues noted below.  Past medical history,surgical history, problem list, medications, allergies, family history and social history were all reviewed and documented as reviewed in the EPIC chart.  ROS:  Performed with pertinent positives and negatives included in the history, assessment and plan.   Additional significant findings :  none   Exam: Kim Counsellor Vitals:   10/25/15 1449  BP: 124/76  Height: 5\' 3"  (1.6 m)  Weight: 130 lb (58.968 kg)   General appearance:  Normal affect, orientation and appearance. Skin: Grossly normal HEENT: Without gross lesions.  No cervical or supraclavicular adenopathy. Thyroid normal.  Lungs:  Clear without wheezing, rales or rhonchi Cardiac: RR, without RMG Abdominal:  Soft, nontender, without masses, guarding, rebound, organomegaly or hernia Breasts:  Examined lying and sitting without masses, retractions, discharge or axillary adenopathy. Pelvic:  Ext/BUS/vagina with atrophic changes  Cervix with atrophic changes. Pap smear done  Uterus anteverted, normal size, shape and contour, midline and mobile nontender   Adnexa  Without masses or tenderness    Anus and perineum  Normal   Rectovaginal  Normal sphincter tone without palpated masses or tenderness.    Assessment/Plan:  75 y.o. G80P1001 female for breast and pelvic exam.   1. Postmenopausal/atrophic genital changes. Doing well without significant hot flushes, night sweats, vaginal dryness. No vaginal bleeding. Continue to monitor report any issues. 2. Osteoporosis. DEXA 2013 T score -2.8.had been started on Boniva by her primary provider and was on it for 2 years but stopped it this past spring due to GI side effects. Recommend baseline DEXA now she is going to schedule this. Increase calcium vitamin D reviewed. Reports having vitamin D levels checked at her primary physician's office.   States that she probably would not take medication regardless by reviewed the benefits of a new baseline now that at least we can compare to in the future. 3. Pap smear 2012. Pap smear done today. No history of significant abnormal Pap smears. Options to stop screening altogether or less frequent screening intervals reviewed. Will readdress on an annual basis. 4. Colonoscopy 2010. Repeat at their recommended interval. 5. Mammography 12/2014. Continue annual mammography when due. SBE monthly reviewed. 6. Health maintenance. No routine lab work done as this is done at her primary provider's office. Follow up 1 year, sooner as needed.   Anastasio Auerbach MD, 3:20 PM 10/25/2015

## 2015-10-25 NOTE — Patient Instructions (Signed)
Follow up for bone density as scheduled.  You may obtain a copy of any labs that were done today by logging onto MyChart as outlined in the instructions provided with your AVS (after visit summary). The office will not call with normal lab results but certainly if there are any significant abnormalities then we will contact you.   Health Maintenance Adopting a healthy lifestyle and getting preventive care can go a long way to promote health and wellness. Talk with your health care provider about what schedule of regular examinations is right for you. This is a good chance for you to check in with your provider about disease prevention and staying healthy. In between checkups, there are plenty of things you can do on your own. Experts have done a lot of research about which lifestyle changes and preventive measures are most likely to keep you healthy. Ask your health care provider for more information. WEIGHT AND DIET  Eat a healthy diet  Be sure to include plenty of vegetables, fruits, low-fat dairy products, and lean protein.  Do not eat a lot of foods high in solid fats, added sugars, or salt.  Get regular exercise. This is one of the most important things you can do for your health.  Most adults should exercise for at least 150 minutes each week. The exercise should increase your heart rate and make you sweat (moderate-intensity exercise).  Most adults should also do strengthening exercises at least twice a week. This is in addition to the moderate-intensity exercise.  Maintain a healthy weight  Body mass index (BMI) is a measurement that can be used to identify possible weight problems. It estimates body fat based on height and weight. Your health care provider can help determine your BMI and help you achieve or maintain a healthy weight.  For females 69 years of age and older:   A BMI below 18.5 is considered underweight.  A BMI of 18.5 to 24.9 is normal.  A BMI of 25 to 29.9 is  considered overweight.  A BMI of 30 and above is considered obese.  Watch levels of cholesterol and blood lipids  You should start having your blood tested for lipids and cholesterol at 75 years of age, then have this test every 5 years.  You may need to have your cholesterol levels checked more often if:  Your lipid or cholesterol levels are high.  You are older than 75 years of age.  You are at high risk for heart disease.  CANCER SCREENING   Lung Cancer  Lung cancer screening is recommended for adults 30-82 years old who are at high risk for lung cancer because of a history of smoking.  A yearly low-dose CT scan of the lungs is recommended for people who:  Currently smoke.  Have quit within the past 15 years.  Have at least a 30-pack-year history of smoking. A pack year is smoking an average of one pack of cigarettes a day for 1 year.  Yearly screening should continue until it has been 15 years since you quit.  Yearly screening should stop if you develop a health problem that would prevent you from having lung cancer treatment.  Breast Cancer  Practice breast self-awareness. This means understanding how your breasts normally appear and feel.  It also means doing regular breast self-exams. Let your health care provider know about any changes, no matter how small.  If you are in your 20s or 30s, you should have a clinical breast exam (  CBE) by a health care provider every 1-3 years as part of a regular health exam.  If you are 56 or older, have a CBE every year. Also consider having a breast X-ray (mammogram) every year.  If you have a family history of breast cancer, talk to your health care provider about genetic screening.  If you are at high risk for breast cancer, talk to your health care provider about having an MRI and a mammogram every year.  Breast cancer gene (BRCA) assessment is recommended for women who have family members with BRCA-related cancers.  BRCA-related cancers include:  Breast.  Ovarian.  Tubal.  Peritoneal cancers.  Results of the assessment will determine the need for genetic counseling and BRCA1 and BRCA2 testing. Cervical Cancer Routine pelvic examinations to screen for cervical cancer are no longer recommended for nonpregnant women who are considered low risk for cancer of the pelvic organs (ovaries, uterus, and vagina) and who do not have symptoms. A pelvic examination may be necessary if you have symptoms including those associated with pelvic infections. Ask your health care provider if a screening pelvic exam is right for you.   The Pap test is the screening test for cervical cancer for women who are considered at risk.  If you had a hysterectomy for a problem that was not cancer or a condition that could lead to cancer, then you no longer need Pap tests.  If you are older than 65 years, and you have had normal Pap tests for the past 10 years, you no longer need to have Pap tests.  If you have had past treatment for cervical cancer or a condition that could lead to cancer, you need Pap tests and screening for cancer for at least 20 years after your treatment.  If you no longer get a Pap test, assess your risk factors if they change (such as having a new sexual partner). This can affect whether you should start being screened again.  Some women have medical problems that increase their chance of getting cervical cancer. If this is the case for you, your health care provider may recommend more frequent screening and Pap tests.  The human papillomavirus (HPV) test is another test that may be used for cervical cancer screening. The HPV test looks for the virus that can cause cell changes in the cervix. The cells collected during the Pap test can be tested for HPV.  The HPV test can be used to screen women 39 years of age and older. Getting tested for HPV can extend the interval between normal Pap tests from three to  five years.  An HPV test also should be used to screen women of any age who have unclear Pap test results.  After 75 years of age, women should have HPV testing as often as Pap tests.  Colorectal Cancer  This type of cancer can be detected and often prevented.  Routine colorectal cancer screening usually begins at 75 years of age and continues through 75 years of age.  Your health care provider may recommend screening at an earlier age if you have risk factors for colon cancer.  Your health care provider may also recommend using home test kits to check for hidden blood in the stool.  A small camera at the end of a tube can be used to examine your colon directly (sigmoidoscopy or colonoscopy). This is done to check for the earliest forms of colorectal cancer.  Routine screening usually begins at age 77.  Direct examination of the colon should be repeated every 5-10 years through 75 years of age. However, you may need to be screened more often if early forms of precancerous polyps or small growths are found. Skin Cancer  Check your skin from head to toe regularly.  Tell your health care provider about any new moles or changes in moles, especially if there is a change in a mole's shape or color.  Also tell your health care provider if you have a mole that is larger than the size of a pencil eraser.  Always use sunscreen. Apply sunscreen liberally and repeatedly throughout the day.  Protect yourself by wearing long sleeves, pants, a wide-brimmed hat, and sunglasses whenever you are outside. HEART DISEASE, DIABETES, AND HIGH BLOOD PRESSURE   Have your blood pressure checked at least every 1-2 years. High blood pressure causes heart disease and increases the risk of stroke.  If you are between 76 years and 55 years old, ask your health care provider if you should take aspirin to prevent strokes.  Have regular diabetes screenings. This involves taking a blood sample to check your  fasting blood sugar level.  If you are at a normal weight and have a low risk for diabetes, have this test once every three years after 75 years of age.  If you are overweight and have a high risk for diabetes, consider being tested at a younger age or more often. PREVENTING INFECTION  Hepatitis B  If you have a higher risk for hepatitis B, you should be screened for this virus. You are considered at high risk for hepatitis B if:  You were born in a country where hepatitis B is common. Ask your health care provider which countries are considered high risk.  Your parents were born in a high-risk country, and you have not been immunized against hepatitis B (hepatitis B vaccine).  You have HIV or AIDS.  You use needles to inject street drugs.  You live with someone who has hepatitis B.  You have had sex with someone who has hepatitis B.  You get hemodialysis treatment.  You take certain medicines for conditions, including cancer, organ transplantation, and autoimmune conditions. Hepatitis C  Blood testing is recommended for:  Everyone born from [birth year] through 1965.  Anyone with known risk factors for hepatitis C. Sexually transmitted infections (STIs)  You should be screened for sexually transmitted infections (STIs) including gonorrhea and chlamydia if:  You are sexually active and are younger than 75 years of age.  You are older than 75 years of age and your health care provider tells you that you are at risk for this type of infection.  Your sexual activity has changed since you were last screened and you are at an increased risk for chlamydia or gonorrhea. Ask your health care provider if you are at risk.  If you do not have HIV, but are at risk, it may be recommended that you take a prescription medicine daily to prevent HIV infection. This is called pre-exposure prophylaxis (PrEP). You are considered at risk if:  You are sexually active and do not regularly use condoms or  know the HIV status of your partner(s).  You take drugs by injection.  You are sexually active with a partner who has HIV. Talk with your health care provider about whether you are at high risk of being infected with HIV. If you choose to begin PrEP, you should first be tested for HIV. You should then be  tested every 3 months for as long as you are taking PrEP.  PREGNANCY   If you are premenopausal and you may become pregnant, ask your health care provider about preconception counseling.  If you may become pregnant, take 400 to 800 micrograms (mcg) of folic acid every day.  If you want to prevent pregnancy, talk to your health care provider about birth control (contraception). OSTEOPOROSIS AND MENOPAUSE   Osteoporosis is a disease in which the bones lose minerals and strength with aging. This can result in serious bone fractures. Your risk for osteoporosis can be identified using a bone density scan.  If you are 25 years of age or older, or if you are at risk for osteoporosis and fractures, ask your health care provider if you should be screened.  Ask your health care provider whether you should take a calcium or vitamin D supplement to lower your risk for osteoporosis.  Menopause may have certain physical symptoms and risks.  Hormone replacement therapy may reduce some of these symptoms and risks. Talk to your health care provider about whether hormone replacement therapy is right for you.  HOME CARE INSTRUCTIONS   Schedule regular health, dental, and eye exams.  Stay current with your immunizations.   Do not use any tobacco products including cigarettes, chewing tobacco, or electronic cigarettes.  If you are pregnant, do not drink alcohol.  If you are breastfeeding, limit how much and how often you drink alcohol.  Limit alcohol intake to no more than 1 drink per day for nonpregnant women. One drink equals 12 ounces of beer, 5 ounces of wine, or 1 ounces of hard liquor.  Do  not use street drugs.  Do not share needles.  Ask your health care provider for help if you need support or information about quitting drugs.  Tell your health care provider if you often feel depressed.  Tell your health care provider if you have ever been abused or do not feel safe at home. Document Released: 07/02/2011 Document Revised: 05/03/2014 Document Reviewed: 11/18/2013 [facility] Patient Information 2015 [place], [place]. This information is not intended to replace advice given to you by your health care provider. Make sure you discuss any questions you have with your health care provider.

## 2015-10-28 LAB — CYTOLOGY - PAP

## 2015-11-12 ENCOUNTER — Other Ambulatory Visit: Payer: Self-pay | Admitting: Family Medicine

## 2015-11-14 NOTE — Telephone Encounter (Signed)
Refills came in for her today. Looks like she had CPE with gyn in Oct. Would she be past due or due in Feb 2017 for med check?

## 2015-11-14 NOTE — Telephone Encounter (Signed)
Left message for patient to return my call.

## 2015-11-14 NOTE — Telephone Encounter (Signed)
She was supposed to schedule a physical with me 6 mos from last visit. Past due for med check (and/or CPE, without GYN part)--can put on cancelation list if she wants, but at least set up for a med check

## 2015-11-15 ENCOUNTER — Telehealth: Payer: Self-pay | Admitting: Family Medicine

## 2015-11-15 MED ORDER — ATORVASTATIN CALCIUM 10 MG PO TABS: 10.0000 mg | ORAL_TABLET | Freq: Every day | ORAL | Status: DC

## 2015-11-15 MED ORDER — LOSARTAN POTASSIUM 25 MG PO TABS: 25.0000 mg | ORAL_TABLET | Freq: Every day | ORAL | Status: DC

## 2015-11-15 NOTE — Telephone Encounter (Signed)
Sent med to pharmacy  

## 2015-11-15 NOTE — Telephone Encounter (Signed)
Pt made fasting med ck for 01/11/16. Need refill on Losartan 25mg  and Atorvastatin 10mg  to last until that appt. She will be out of meds today.

## 2016-01-04 ENCOUNTER — Encounter: Payer: Self-pay | Admitting: Family Medicine

## 2016-01-04 ENCOUNTER — Ambulatory Visit (INDEPENDENT_AMBULATORY_CARE_PROVIDER_SITE_OTHER): Payer: Medicare HMO | Admitting: Family Medicine

## 2016-01-04 VITALS — BP 130/70 | HR 74 | Resp 12 | Ht 63.0 in | Wt 126.8 lb

## 2016-01-04 DIAGNOSIS — Z23 Encounter for immunization: Secondary | ICD-10-CM | POA: Diagnosis not present

## 2016-01-04 DIAGNOSIS — I10 Essential (primary) hypertension: Secondary | ICD-10-CM | POA: Diagnosis not present

## 2016-01-04 DIAGNOSIS — E7800 Pure hypercholesterolemia, unspecified: Secondary | ICD-10-CM

## 2016-01-04 DIAGNOSIS — M810 Age-related osteoporosis without current pathological fracture: Secondary | ICD-10-CM

## 2016-01-04 LAB — COMPREHENSIVE METABOLIC PANEL
ALBUMIN: 3.6 g/dL (ref 3.6–5.1)
ALT: 16 U/L (ref 6–29)
AST: 18 U/L (ref 10–35)
Alkaline Phosphatase: 115 U/L (ref 33–130)
BILIRUBIN TOTAL: 0.4 mg/dL (ref 0.2–1.2)
BUN: 18 mg/dL (ref 7–25)
CALCIUM: 9.3 mg/dL (ref 8.6–10.4)
CHLORIDE: 102 mmol/L (ref 98–110)
CO2: 25 mmol/L (ref 20–31)
Creat: 0.92 mg/dL (ref 0.60–0.93)
GLUCOSE: 95 mg/dL (ref 65–99)
Potassium: 4.4 mmol/L (ref 3.5–5.3)
SODIUM: 139 mmol/L (ref 135–146)
Total Protein: 7.2 g/dL (ref 6.1–8.1)

## 2016-01-04 LAB — LIPID PANEL
Cholesterol: 177 mg/dL (ref 125–200)
HDL: 54 mg/dL (ref >=46)
LDL CALC: 102 mg/dL (ref <130)
Total CHOL/HDL Ratio: 3.3 Ratio (ref <=5.0)
Triglycerides: 107 mg/dL (ref <150)
VLDL: 21 mg/dL (ref <30)

## 2016-01-04 LAB — TSH: TSH: 3.332 u[IU]/mL (ref 0.350–4.500)

## 2016-01-04 MED ORDER — ATORVASTATIN CALCIUM 10 MG PO TABS: 10.0000 mg | ORAL_TABLET | Freq: Every day | ORAL | Status: DC

## 2016-01-04 MED ORDER — LOSARTAN POTASSIUM 25 MG PO TABS: 25.0000 mg | ORAL_TABLET | Freq: Every day | ORAL | Status: DC

## 2016-01-04 NOTE — Patient Instructions (Signed)
Continue your current medications. We will be in touch with your test results in the next week.  I recommend seeing the Health Department or [facility] regarding foreign travel.  Feel free to look up information also on the [facility] website.

## 2016-01-04 NOTE — Progress Notes (Signed)
Chief Complaint  Patient presents with  . Hypertension    nonfasting med check.(blood in lab, came in this morning for draw). Wants you to look at a mole on her L shoulder. Hadn't noticed it being there until recently    Had some posterior neck pain and jaw pain/stiffness, when in [place] around Thanksgiving, relieved by heat and advil. Some jaw pain/"tired" if she chews a lot since then, but neck pain resolved.  Patient presents to follow up on hypertension. She has been on 29m of losartan for about a year, and is tolerating without side effects.BP has been ranging from 120-130/70-80. Denies headaches, dizziness, chest pain, edema, shortness of breath.  Hyperlipidemia follow-up:  Patient is reportedly following a low-fat, low cholesterol diet.  She admits that [place]. She has been back on it for a week.  Denies medication side effects.  Saw GYN in November (Dr. FPhineas Real. Osteoporosis--stopped taking Boniva due to GI problems. She is scheduling another DEXA, has discussed with Dr. FPhineas Real(who ordered test).  [place] not [place]. [place]; reports travel agent didn't suggest any medications or vaccinations.  PMH, PSH, SH reviewed  Outpatient Encounter Prescriptions as of 01/04/2016  Medication Sig  . aspirin 81 MG tablet Take 81 mg by mouth daily.    .Marland Kitchenatorvastatin (LIPITOR) 10 MG tablet Take 1 tablet (10 mg total) by mouth daily.  . Calcium Carbonate-Vitamin D (CALCIUM-VITAMIN D) 500-200 MG-UNIT per tablet Take 1 tablet by mouth daily.    .Marland Kitchenco-enzyme Q-10 30 MG capsule Take 100 mg by mouth daily.   . fish oil-omega-3 fatty acids 1000 MG capsule Take 1 g by mouth daily.    .Marland Kitchenlosartan (COZAAR) 25 MG tablet Take 1 tablet (25 mg total) by mouth daily.  . Multiple Vitamins-Minerals (MULTIVITAMIN WITH MINERALS) tablet Take 1  tablet by mouth daily.     No facility-administered encounter medications on file as of 01/04/2016.   Allergies  Allergen Reactions  . Sulfa Antibiotics Rash   ROS: no fever, chills, URI symptoms, cough, shortness of breath, chest pain, palpitations, nausea, vomiting, bowel changes, bleeding, bruising, rash. No joint pains; some intermittent jaw pain with eating hard foods (ie large apple). No depression, anxiety.  PHYSICAL EXAM: BP 130/70 mmHg  Pulse 74  Resp 12  Ht 5' 3"  (1.6 m)  Wt 126 lb 12.8 oz (57.516 kg)  BMI 22.47 kg/m2  Well developed, pleasant female in no distress HEENT: PERRL, EOMI, conjunctiva and sclera are clear. Cerumen noted in R ear, obstructing view of TM. Normal TM and EAC on the left. OP is clear. Neck: no lymphadenopathy, thyromegaly or mass Heart: regular rate and rhythm without murmur Lungs: clear bilaterally Abdomen: soft, nontender, no organomegaly or mass Extremities: No edema, normal pulses Neuro: alert and oriented, cranial nerves intact, normal strength, sensation, gait Psych: normal mood, affect, hygiene and grooming Skin: normal turgor, no rash/lesions.  ASSESSMENT/PLAN:  Essential hypertension - controlled - Plan: Comprehensive metabolic panel, losartan (COZAAR) 25 MG tablet  Immunization due - risks/side effects reviewed - Plan: Pneumococcal conjugate vaccine 13-valent  Pure hypercholesterolemia - continue atorvastatin. If lipids higher than usual, due to recently missed pills. - Plan: Lipid panel, Comprehensive metabolic panel, atorvastatin (LIPITOR) 10 MG tablet  Osteoporosis - f/u DEXA as scheduled per GYN - Plan: TSH  Intermittent jaw pain sounds c/w mild  TMJ problems--pt reassured, counseled.  Cerumen impaction on R--asymptomatic. May use OTC drops prn if notices any decreased hearing or discomfort due to cerumen.  c-met, lipids TSH  prevnar-13 today  F/u 1 year, sooner prn for elevated BP's, abnormal labs. Continue regular f/u with  GYN. Recommend CPE/AWV for next year. Updated immunizations today. Per Dr. Zelphia Cairo records, last colonoscopy was 2010 (no records in epic)

## 2016-01-11 ENCOUNTER — Encounter: Payer: Medicare HMO | Admitting: Family Medicine

## 2016-03-05 DIAGNOSIS — Z803 Family history of malignant neoplasm of breast: Secondary | ICD-10-CM | POA: Diagnosis not present

## 2016-03-05 DIAGNOSIS — Z1231 Encounter for screening mammogram for malignant neoplasm of breast: Secondary | ICD-10-CM | POA: Diagnosis not present

## 2016-03-05 LAB — HM MAMMOGRAPHY: HM MAMMO: NORMAL

## 2016-03-06 ENCOUNTER — Encounter: Payer: Self-pay | Admitting: Gynecology

## 2016-03-06 ENCOUNTER — Telehealth: Payer: Self-pay | Admitting: Family Medicine

## 2016-04-11 NOTE — Telephone Encounter (Signed)
04/11/2016 °

## 2016-05-15 DIAGNOSIS — R69 Illness, unspecified: Secondary | ICD-10-CM | POA: Diagnosis not present

## 2016-07-09 DIAGNOSIS — H2513 Age-related nuclear cataract, bilateral: Secondary | ICD-10-CM | POA: Diagnosis not present

## 2016-07-09 DIAGNOSIS — H52203 Unspecified astigmatism, bilateral: Secondary | ICD-10-CM | POA: Diagnosis not present

## 2016-10-09 DIAGNOSIS — R69 Illness, unspecified: Secondary | ICD-10-CM | POA: Diagnosis not present

## 2016-10-11 DIAGNOSIS — H2511 Age-related nuclear cataract, right eye: Secondary | ICD-10-CM | POA: Diagnosis not present

## 2016-10-11 DIAGNOSIS — H25811 Combined forms of age-related cataract, right eye: Secondary | ICD-10-CM | POA: Diagnosis not present

## 2016-10-25 DIAGNOSIS — H2512 Age-related nuclear cataract, left eye: Secondary | ICD-10-CM | POA: Diagnosis not present

## 2016-10-25 DIAGNOSIS — H25812 Combined forms of age-related cataract, left eye: Secondary | ICD-10-CM | POA: Diagnosis not present

## 2016-11-30 DIAGNOSIS — Z1283 Encounter for screening for malignant neoplasm of skin: Secondary | ICD-10-CM | POA: Diagnosis not present

## 2016-11-30 DIAGNOSIS — Z85828 Personal history of other malignant neoplasm of skin: Secondary | ICD-10-CM | POA: Diagnosis not present

## 2016-11-30 DIAGNOSIS — X32XXXD Exposure to sunlight, subsequent encounter: Secondary | ICD-10-CM | POA: Diagnosis not present

## 2016-11-30 DIAGNOSIS — L570 Actinic keratosis: Secondary | ICD-10-CM | POA: Diagnosis not present

## 2016-11-30 DIAGNOSIS — Z08 Encounter for follow-up examination after completed treatment for malignant neoplasm: Secondary | ICD-10-CM | POA: Diagnosis not present

## 2016-11-30 DIAGNOSIS — L820 Inflamed seborrheic keratosis: Secondary | ICD-10-CM | POA: Diagnosis not present

## 2016-12-01 NOTE — Progress Notes (Signed)
Chief Complaint  Patient presents with  . Hypertension    med check. Moving, leaving Wed for [place] would like to take 6 months of meds with her until she can establish care down there. Also requesting refill on ambien.     It was recommended that she schedule AWV, but appears that she has only scheduled med check.  She is moving full time to [place] later this week.  She is requesting medication refills, including Ambien.  She has been having some trouble sleeping related to stress of moving. She has been using 1/3rd of the ambien tablets given (after her son died)--still has 5-6 left.   Wants to have some more just in case.  Notices that her left great toe is "wanting to turn [place]".  Her mother had hammertoes, wondering if there is anything she can do to prevent foot problems. She denies any pain.  Hypertension follow-up: She takes 98m of losartan and is tolerating without side effects.BP has been ranging from 130's/70's-80, notes it has been a little higher than it used to be related to stress and poor sleep. Denies headaches, dizziness, chest pain, edema, shortness of breath, edema.  Hyperlipidemia follow-up:  Patient is reportedly following a low-fat, low cholesterol diet.  She reports compliance with lipitor and denies medication side effects. Only occasional muscle ache (every few weeks, at night).  Osteoporosis--treated and monitored in the past by her GYN, Dr. FPhineas Real  She had stopped taking Boniva due to GI problems. She was supposed to schedule another DEXA.  She never had this rechecked. She takes calcium, Vitamin D, and gets weight bearing exercise (Silver Sneakers, just once a week now). She doesn't plan to see him before moving, last seen 10/2015.   Immunization History  Administered Date(s) Administered  . DTaP 01/01/2004  . Influenza Split 10/19/2014  . Influenza, High Dose Seasonal PF 10/29/2016  . Influenza,inj,Quad PF,36+ Mos 09/23/2013  . Influenza-Unspecified  11/01/2015  . Pneumococcal Conjugate-13 03/31/2006, 01/04/2016  . Tdap 11/05/2013    Need to change pneumonia vaccines in system--the one in 2007 was pneumovax, not prevar.  Her vaccines are UTD.  Shingles vaccine has been recommended, but not yet received.  PMH, PSH, SH and FH reviewed and updated.  Outpatient Encounter Prescriptions as of 12/03/2016  Medication Sig  . aspirin 81 MG tablet Take 81 mg by mouth daily.    .Marland Kitchenatorvastatin (LIPITOR) 10 MG tablet Take 1 tablet (10 mg total) by mouth daily.  . Calcium Carbonate-Vitamin D (CALCIUM-VITAMIN D) 500-200 MG-UNIT per tablet Take 1 tablet by mouth daily.    .Marland Kitchenco-enzyme Q-10 30 MG capsule Take 100 mg by mouth daily.   . fish oil-omega-3 fatty acids 1000 MG capsule Take 1 g by mouth daily.    .Marland Kitchenlosartan (COZAAR) 25 MG tablet Take 1 tablet (25 mg total) by mouth daily.  . Multiple Vitamins-Minerals (MULTIVITAMIN WITH MINERALS) tablet Take 1 tablet by mouth daily.    . [DISCONTINUED] atorvastatin (LIPITOR) 10 MG tablet Take 1 tablet (10 mg total) by mouth daily.  . [DISCONTINUED] losartan (COZAAR) 25 MG tablet Take 1 tablet (25 mg total) by mouth daily.  .Marland Kitchenzolpidem (AMBIEN) 5 MG tablet Take 1 tablet (5 mg total) by mouth at bedtime as needed for sleep.   No facility-administered encounter medications on file as of 12/03/2016.    Allergies  Allergen Reactions  . Sulfa Antibiotics Rash   ROS: no fever, chills, URI symptoms, cough, shortness of breath, chest pain, palpitations, nausea, vomiting,  bowel changes, bleeding, bruising, rash. No joint pains. No depression, anxiety. Some insomnia, intermittent. Some watery eyes and runny nose recently. See HPI    PHYSICAL EXAM:  BP 138/84 (BP Location: Left Arm, Patient Position: Sitting, Cuff Size: Normal)   Pulse 72   Ht 5' 3"  (1.6 m)   Wt 125 lb (56.7 kg)   BMI 22.14 kg/m    Well developed, pleasant female in no distress HEENT: PERRL, EOMI, conjunctiva and sclera are clear. Cerumen  noted in R ear, obstructing view of TM. Normal TM and EAC on the left. OP is clear.  Nasal mucosa is mild-mod edematous with clear-white mucus present. Neck: no lymphadenopathy, thyromegaly or mass.  No carotid bruit. Heart: regular rate and rhythm without murmur Lungs: clear bilaterally Abdomen: soft, nontender, no organomegaly or mass Extremities: No edema, normal pulses. Mild bunion deformity on the left great toe, with slight deviation of toe laterally.  No hammertoe noted. nontender at first MTP. Neuro: alert and oriented, cranial nerves intact, normal strength, sensation, gait Back: no CVA or spinal tenderness Psych: normal mood, affect, hygiene and grooming Skin: normal turgor, no rash/lesions.   ASSESSMENT/PLAN:  Essential hypertension - well controlled on current regimen - Plan: Comprehensive metabolic panel, losartan (COZAAR) 25 MG tablet  Pure hypercholesterolemia - Due for labs. Continue lowfat, low cholesterol diet (note that she was nonfasting today, hadn't eaten in 3-4 hours) - Plan: Comprehensive metabolic panel, Lipid panel, atorvastatin (LIPITOR) 10 MG tablet  Osteoporosis without current pathological fracture, unspecified osteoporosis type - discussed calcium, Vitamin D, weight-bearing exercise. f/u DEXA recommended when she establishes with MD in [place]  Pure hypercholesterolemia - continue atorvastatin. If lipids higher than usual, due to recently missed pills. - Plan: Comprehensive metabolic panel, Lipid panel, atorvastatin (LIPITOR) 10 MG tablet  Essential hypertension - controlled - Plan: Comprehensive metabolic panel, losartan (COZAAR) 25 MG tablet  Insomnia, unspecified type - intermittent. Change to 82m zolpidem, continue to use low dose prn - Plan: zolpidem (AMBIEN) 5 MG tablet  Bunion of great toe - avoid heels, ensure shoes wide enough, consider pad between 1st and 2nd toe.  f/u with podiatrist if pain or worsening appearance   c-met, lipids  Correct  pneumonia vaccine to pneumovax in system for 2007  Shingrix recommended when available (as opposed to zostavax)  Last ate 3 hours ago Can't return prior to moving, so will check nonfasting labs today    Please establish care with a new gynecologist when you [place]  You are due for a follow up bone density test.  Try and increase your upper body weight-bearing exercise to at least 2-3 times/week.  I encourage you to look into getting the new shingles vaccine (Shingrix) when available.

## 2016-12-03 ENCOUNTER — Ambulatory Visit (INDEPENDENT_AMBULATORY_CARE_PROVIDER_SITE_OTHER): Payer: Medicare HMO | Admitting: Family Medicine

## 2016-12-03 ENCOUNTER — Encounter: Payer: Self-pay | Admitting: Family Medicine

## 2016-12-03 VITALS — BP 138/84 | HR 72 | Ht 63.0 in | Wt 125.0 lb

## 2016-12-03 DIAGNOSIS — I10 Essential (primary) hypertension: Secondary | ICD-10-CM | POA: Diagnosis not present

## 2016-12-03 DIAGNOSIS — E7800 Pure hypercholesterolemia, unspecified: Secondary | ICD-10-CM

## 2016-12-03 DIAGNOSIS — M21619 Bunion of unspecified foot: Secondary | ICD-10-CM | POA: Diagnosis not present

## 2016-12-03 DIAGNOSIS — M810 Age-related osteoporosis without current pathological fracture: Secondary | ICD-10-CM

## 2016-12-03 DIAGNOSIS — G4700 Insomnia, unspecified: Secondary | ICD-10-CM

## 2016-12-03 LAB — COMPREHENSIVE METABOLIC PANEL
ALBUMIN: 3.8 g/dL (ref 3.6–5.1)
ALK PHOS: 118 U/L (ref 33–130)
ALT: 20 U/L (ref 6–29)
AST: 22 U/L (ref 10–35)
BILIRUBIN TOTAL: 0.3 mg/dL (ref 0.2–1.2)
BUN: 19 mg/dL (ref 7–25)
CALCIUM: 9.4 mg/dL (ref 8.6–10.4)
CO2: 28 mmol/L (ref 20–31)
CREATININE: 1.15 mg/dL — AB (ref 0.60–0.93)
Chloride: 104 mmol/L (ref 98–110)
Glucose, Bld: 87 mg/dL (ref 65–99)
Potassium: 4.5 mmol/L (ref 3.5–5.3)
SODIUM: 138 mmol/L (ref 135–146)
TOTAL PROTEIN: 7.4 g/dL (ref 6.1–8.1)

## 2016-12-03 LAB — LIPID PANEL
CHOLESTEROL: 170 mg/dL (ref <200)
HDL: 62 mg/dL (ref >50)
LDL Cholesterol: 77 mg/dL (ref <100)
TRIGLYCERIDES: 153 mg/dL — AB (ref <150)
Total CHOL/HDL Ratio: 2.7 Ratio (ref <5.0)
VLDL: 31 mg/dL — ABNORMAL HIGH (ref <30)

## 2016-12-03 MED ORDER — ATORVASTATIN CALCIUM 10 MG PO TABS: 10.0000 mg | ORAL_TABLET | Freq: Every day | ORAL | 3 refills | Status: AC

## 2016-12-03 MED ORDER — LOSARTAN POTASSIUM 25 MG PO TABS: 25.0000 mg | ORAL_TABLET | Freq: Every day | ORAL | 3 refills | Status: AC

## 2016-12-03 MED ORDER — ZOLPIDEM TARTRATE 5 MG PO TABS: 5.0000 mg | ORAL_TABLET | Freq: Every evening | ORAL | 0 refills | Status: AC | PRN

## 2016-12-03 NOTE — Patient Instructions (Signed)
  Please establish care with a new gynecologist when you move to [place].  You are due for a follow up bone density test.  Try and increase your upper body weight-bearing exercise to at least 2-3 times/week.  I encourage you to look into getting the new shingles vaccine (Shingrix) when available.

## 2016-12-05 ENCOUNTER — Encounter: Payer: Self-pay | Admitting: Family Medicine

## 2016-12-05 ENCOUNTER — Ambulatory Visit (INDEPENDENT_AMBULATORY_CARE_PROVIDER_SITE_OTHER): Payer: Medicare HMO | Admitting: Family Medicine

## 2016-12-05 VITALS — BP 112/66 | Wt 124.0 lb

## 2016-12-05 DIAGNOSIS — B029 Zoster without complications: Secondary | ICD-10-CM | POA: Diagnosis not present

## 2016-12-05 MED ORDER — VALACYCLOVIR HCL 1 G PO TABS: 1000.0000 mg | ORAL_TABLET | Freq: Three times a day (TID) | ORAL | 0 refills | Status: AC

## 2016-12-05 NOTE — Progress Notes (Signed)
   Subjective:    Patient ID: Holly Russo, female    DOB: 21-Jan-1940, 76 y.o.   MRN: [id number]  HPI She noted some slight itching and hypersensitivity on the right forehead. She did bring in the bottle of Valtrex that she got in 2005 when she had shingles in the same nerve root distribution. She is having no ocular symptoms but has had  recent cataract surgery Review of Systems     Objective:   Physical Exam Hypersensitivity is noted in the right forehead and temporal area with an area of erythema and early bullous formation vertically midline. Exam of the eye area shows no erythema       Assessment & Plan:  Herpes zoster without complication - Plan: valACYclovir (VALTREX) 1000 MG tablet Discussed care of this including ocular issues. She will go by her ophthalmologist office this morning on her own. She is supposed to leave to move to [place] today. Also discussed possible follow-up shingles injection at some point down the road especially with the new formulation that is about to come out.

## 2016-12-27 ENCOUNTER — Telehealth: Payer: Self-pay | Admitting: Family Medicine

## 2016-12-27 NOTE — Telephone Encounter (Signed)
Review and sent prior auth to [facility] for Ambien   Faxed to [facility]

## 2017-01-15 ENCOUNTER — Telehealth: Payer: Self-pay | Admitting: Family Medicine

## 2017-01-15 NOTE — Telephone Encounter (Signed)
P.A. ZOLPIDEM TARTRATE

## 2017-01-15 NOTE — Telephone Encounter (Signed)
Recv'd approval til 12/30/17, left message for pt, faxed pharmacy

## 2017-05-21 DIAGNOSIS — R69 Illness, unspecified: Secondary | ICD-10-CM | POA: Diagnosis not present

## 2017-09-30 DIAGNOSIS — L908 Other atrophic disorders of skin: Secondary | ICD-10-CM | POA: Diagnosis not present

## 2017-09-30 DIAGNOSIS — D1801 Hemangioma of skin and subcutaneous tissue: Secondary | ICD-10-CM | POA: Diagnosis not present

## 2017-09-30 DIAGNOSIS — D2371 Other benign neoplasm of skin of right lower limb, including hip: Secondary | ICD-10-CM | POA: Diagnosis not present

## 2017-09-30 DIAGNOSIS — L821 Other seborrheic keratosis: Secondary | ICD-10-CM | POA: Diagnosis not present

## 2017-10-02 DIAGNOSIS — R69 Illness, unspecified: Secondary | ICD-10-CM | POA: Diagnosis not present

## 2018-01-22 DIAGNOSIS — E7800 Pure hypercholesterolemia, unspecified: Secondary | ICD-10-CM | POA: Diagnosis not present

## 2018-01-22 DIAGNOSIS — Z1231 Encounter for screening mammogram for malignant neoplasm of breast: Secondary | ICD-10-CM | POA: Diagnosis not present

## 2018-01-22 DIAGNOSIS — Z803 Family history of malignant neoplasm of breast: Secondary | ICD-10-CM | POA: Diagnosis not present

## 2018-01-22 DIAGNOSIS — I10 Essential (primary) hypertension: Secondary | ICD-10-CM | POA: Diagnosis not present

## 2018-01-22 DIAGNOSIS — G4700 Insomnia, unspecified: Secondary | ICD-10-CM | POA: Diagnosis not present

## 2018-02-01 ENCOUNTER — Other Ambulatory Visit: Payer: Self-pay | Admitting: Family Medicine

## 2018-02-01 DIAGNOSIS — I10 Essential (primary) hypertension: Secondary | ICD-10-CM

## 2018-02-07 DIAGNOSIS — Z1231 Encounter for screening mammogram for malignant neoplasm of breast: Secondary | ICD-10-CM | POA: Diagnosis not present

## 2018-02-07 DIAGNOSIS — R921 Mammographic calcification found on diagnostic imaging of breast: Secondary | ICD-10-CM | POA: Diagnosis not present

## 2018-02-10 DIAGNOSIS — Z1211 Encounter for screening for malignant neoplasm of colon: Secondary | ICD-10-CM | POA: Diagnosis not present

## 2018-02-10 DIAGNOSIS — I10 Essential (primary) hypertension: Secondary | ICD-10-CM | POA: Diagnosis not present

## 2018-02-17 DIAGNOSIS — Z1211 Encounter for screening for malignant neoplasm of colon: Secondary | ICD-10-CM | POA: Diagnosis not present

## 2018-02-17 DIAGNOSIS — I10 Essential (primary) hypertension: Secondary | ICD-10-CM | POA: Diagnosis not present

## 2018-04-24 DIAGNOSIS — E7800 Pure hypercholesterolemia, unspecified: Secondary | ICD-10-CM | POA: Diagnosis not present

## 2018-04-24 DIAGNOSIS — Z1389 Encounter for screening for other disorder: Secondary | ICD-10-CM | POA: Diagnosis not present

## 2018-04-24 DIAGNOSIS — I10 Essential (primary) hypertension: Secondary | ICD-10-CM | POA: Diagnosis not present

## 2018-04-24 DIAGNOSIS — H6121 Impacted cerumen, right ear: Secondary | ICD-10-CM | POA: Diagnosis not present

## 2018-04-24 DIAGNOSIS — Z0000 Encounter for general adult medical examination without abnormal findings: Secondary | ICD-10-CM | POA: Diagnosis not present

## 2018-04-24 DIAGNOSIS — G4700 Insomnia, unspecified: Secondary | ICD-10-CM | POA: Diagnosis not present

## 2018-06-13 DIAGNOSIS — L0291 Cutaneous abscess, unspecified: Secondary | ICD-10-CM | POA: Diagnosis not present

## 2018-06-13 DIAGNOSIS — E7800 Pure hypercholesterolemia, unspecified: Secondary | ICD-10-CM | POA: Diagnosis not present

## 2018-06-13 DIAGNOSIS — I10 Essential (primary) hypertension: Secondary | ICD-10-CM | POA: Diagnosis not present

## 2018-06-18 ENCOUNTER — Telehealth: Payer: Self-pay

## 2018-06-18 NOTE — Telephone Encounter (Signed)
Called pt to make an awv appt. No answer lVM kh

## 2018-06-20 DIAGNOSIS — H35363 Drusen (degenerative) of macula, bilateral: Secondary | ICD-10-CM | POA: Diagnosis not present

## 2018-06-20 DIAGNOSIS — Z961 Presence of intraocular lens: Secondary | ICD-10-CM | POA: Diagnosis not present

## 2018-06-20 DIAGNOSIS — H04123 Dry eye syndrome of bilateral lacrimal glands: Secondary | ICD-10-CM | POA: Diagnosis not present

## 2018-06-20 DIAGNOSIS — H02834 Dermatochalasis of left upper eyelid: Secondary | ICD-10-CM | POA: Diagnosis not present

## 2018-06-20 DIAGNOSIS — H02831 Dermatochalasis of right upper eyelid: Secondary | ICD-10-CM | POA: Diagnosis not present

## 2018-06-20 DIAGNOSIS — H43813 Vitreous degeneration, bilateral: Secondary | ICD-10-CM | POA: Diagnosis not present

## 2018-06-20 DIAGNOSIS — H01006 Unspecified blepharitis left eye, unspecified eyelid: Secondary | ICD-10-CM | POA: Diagnosis not present

## 2018-06-20 DIAGNOSIS — H26493 Other secondary cataract, bilateral: Secondary | ICD-10-CM | POA: Diagnosis not present

## 2018-06-20 DIAGNOSIS — H01003 Unspecified blepharitis right eye, unspecified eyelid: Secondary | ICD-10-CM | POA: Diagnosis not present

## 2018-07-23 DIAGNOSIS — R69 Illness, unspecified: Secondary | ICD-10-CM | POA: Diagnosis not present

## 2018-08-05 DIAGNOSIS — S29019A Strain of muscle and tendon of unspecified wall of thorax, initial encounter: Secondary | ICD-10-CM | POA: Diagnosis not present

## 2018-08-05 DIAGNOSIS — I10 Essential (primary) hypertension: Secondary | ICD-10-CM | POA: Diagnosis not present

## 2018-08-05 DIAGNOSIS — E785 Hyperlipidemia, unspecified: Secondary | ICD-10-CM | POA: Diagnosis not present

## 2018-10-22 DIAGNOSIS — R69 Illness, unspecified: Secondary | ICD-10-CM | POA: Diagnosis not present

## 2018-11-05 DIAGNOSIS — D2371 Other benign neoplasm of skin of right lower limb, including hip: Secondary | ICD-10-CM | POA: Diagnosis not present

## 2018-11-05 DIAGNOSIS — D692 Other nonthrombocytopenic purpura: Secondary | ICD-10-CM | POA: Diagnosis not present

## 2018-11-05 DIAGNOSIS — L821 Other seborrheic keratosis: Secondary | ICD-10-CM | POA: Diagnosis not present

## 2018-11-05 DIAGNOSIS — D1801 Hemangioma of skin and subcutaneous tissue: Secondary | ICD-10-CM | POA: Diagnosis not present

## 2018-11-21 DIAGNOSIS — E7800 Pure hypercholesterolemia, unspecified: Secondary | ICD-10-CM | POA: Diagnosis not present

## 2018-11-21 DIAGNOSIS — I10 Essential (primary) hypertension: Secondary | ICD-10-CM | POA: Diagnosis not present

## 2018-11-21 DIAGNOSIS — K2990 Gastroduodenitis, unspecified, without bleeding: Secondary | ICD-10-CM | POA: Diagnosis not present

## 2018-12-15 DIAGNOSIS — K2990 Gastroduodenitis, unspecified, without bleeding: Secondary | ICD-10-CM | POA: Diagnosis not present

## 2018-12-19 DIAGNOSIS — K2990 Gastroduodenitis, unspecified, without bleeding: Secondary | ICD-10-CM | POA: Diagnosis not present

## 2018-12-19 DIAGNOSIS — I10 Essential (primary) hypertension: Secondary | ICD-10-CM | POA: Diagnosis not present

## 2019-10-08 ENCOUNTER — Encounter: Payer: Self-pay | Admitting: Gynecology
